# Patient Record
Sex: Male | Born: 1970
Health system: Southern US, Community
[De-identification: ages and names within clinical notes are randomized; demographics above are authoritative.]

## PROBLEM LIST (undated history)

## (undated) DIAGNOSIS — K5730 Diverticulosis of large intestine without perforation or abscess without bleeding: Secondary | ICD-10-CM

## (undated) DIAGNOSIS — I471 Supraventricular tachycardia, unspecified: Secondary | ICD-10-CM

## (undated) DIAGNOSIS — I10 Essential (primary) hypertension: Secondary | ICD-10-CM

## (undated) DIAGNOSIS — K5792 Diverticulitis of intestine, part unspecified, without perforation or abscess without bleeding: Secondary | ICD-10-CM

## (undated) DIAGNOSIS — K219 Gastro-esophageal reflux disease without esophagitis: Secondary | ICD-10-CM

## (undated) DIAGNOSIS — R42 Dizziness and giddiness: Secondary | ICD-10-CM

## (undated) DIAGNOSIS — T7840XA Allergy, unspecified, initial encounter: Secondary | ICD-10-CM

## (undated) DIAGNOSIS — J309 Allergic rhinitis, unspecified: Secondary | ICD-10-CM

## (undated) DIAGNOSIS — E785 Hyperlipidemia, unspecified: Secondary | ICD-10-CM

## (undated) HISTORY — DX: Hyperlipidemia, unspecified: E78.5

## (undated) HISTORY — DX: Allergic rhinitis, unspecified: J30.9

## (undated) HISTORY — DX: Essential (primary) hypertension: I10

## (undated) HISTORY — DX: Gastro-esophageal reflux disease without esophagitis: K21.9

## (undated) HISTORY — DX: Diverticulosis of large intestine without perforation or abscess without bleeding: K57.30

## (undated) HISTORY — DX: Supraventricular tachycardia, unspecified: I47.10

## (undated) HISTORY — DX: Diverticulitis of intestine, part unspecified, without perforation or abscess without bleeding: K57.92

## (undated) HISTORY — PX: NASAL SEPTOPLASTY W/ TURBINOPLASTY: SHX2070

## (undated) HISTORY — DX: Dizziness and giddiness: R42

## (undated) HISTORY — DX: Allergy, unspecified, initial encounter: T78.40XA

## (undated) HISTORY — PX: VASECTOMY: SHX75

## (undated) HISTORY — DX: Supraventricular tachycardia: I47.1

---

## 2006-10-02 ENCOUNTER — Emergency Department (HOSPITAL_COMMUNITY): Admission: EM | Admit: 2006-10-02 | Discharge: 2006-10-02 | Payer: Self-pay | Admitting: Emergency Medicine

## 2006-10-03 ENCOUNTER — Ambulatory Visit: Payer: Self-pay

## 2007-12-05 ENCOUNTER — Encounter: Admission: RE | Admit: 2007-12-05 | Discharge: 2007-12-05 | Payer: Self-pay | Admitting: Internal Medicine

## 2008-01-09 DIAGNOSIS — I10 Essential (primary) hypertension: Secondary | ICD-10-CM | POA: Insufficient documentation

## 2008-01-12 ENCOUNTER — Ambulatory Visit: Payer: Self-pay | Admitting: Internal Medicine

## 2008-01-12 DIAGNOSIS — K219 Gastro-esophageal reflux disease without esophagitis: Secondary | ICD-10-CM | POA: Insufficient documentation

## 2008-01-12 DIAGNOSIS — R198 Other specified symptoms and signs involving the digestive system and abdomen: Secondary | ICD-10-CM | POA: Insufficient documentation

## 2008-01-12 DIAGNOSIS — R079 Chest pain, unspecified: Secondary | ICD-10-CM | POA: Insufficient documentation

## 2008-04-14 ENCOUNTER — Telehealth (INDEPENDENT_AMBULATORY_CARE_PROVIDER_SITE_OTHER): Payer: Self-pay | Admitting: *Deleted

## 2008-04-30 ENCOUNTER — Encounter: Payer: Self-pay | Admitting: Internal Medicine

## 2010-07-05 ENCOUNTER — Emergency Department (HOSPITAL_BASED_OUTPATIENT_CLINIC_OR_DEPARTMENT_OTHER)
Admission: EM | Admit: 2010-07-05 | Discharge: 2010-07-06 | Disposition: A | Discharge status: Home / Self Care | Payer: Worker's Compensation | Attending: Emergency Medicine | Admitting: Emergency Medicine

## 2010-07-05 DIAGNOSIS — Z79899 Other long term (current) drug therapy: Secondary | ICD-10-CM | POA: Insufficient documentation

## 2010-07-05 DIAGNOSIS — Y9289 Other specified places as the place of occurrence of the external cause: Secondary | ICD-10-CM | POA: Insufficient documentation

## 2010-07-05 DIAGNOSIS — I10 Essential (primary) hypertension: Secondary | ICD-10-CM | POA: Insufficient documentation

## 2010-07-05 DIAGNOSIS — E785 Hyperlipidemia, unspecified: Secondary | ICD-10-CM | POA: Insufficient documentation

## 2010-07-05 DIAGNOSIS — S9030XA Contusion of unspecified foot, initial encounter: Secondary | ICD-10-CM | POA: Insufficient documentation

## 2010-07-05 DIAGNOSIS — W208XXA Other cause of strike by thrown, projected or falling object, initial encounter: Secondary | ICD-10-CM | POA: Insufficient documentation

## 2010-07-06 ENCOUNTER — Emergency Department (INDEPENDENT_AMBULATORY_CARE_PROVIDER_SITE_OTHER): Payer: Worker's Compensation

## 2010-07-06 DIAGNOSIS — W208XXA Other cause of strike by thrown, projected or falling object, initial encounter: Secondary | ICD-10-CM

## 2010-07-06 DIAGNOSIS — S9030XA Contusion of unspecified foot, initial encounter: Secondary | ICD-10-CM

## 2010-09-28 ENCOUNTER — Other Ambulatory Visit: Payer: Self-pay | Admitting: Internal Medicine

## 2010-09-28 DIAGNOSIS — R110 Nausea: Secondary | ICD-10-CM

## 2010-09-29 ENCOUNTER — Ambulatory Visit
Admission: RE | Admit: 2010-09-29 | Discharge: 2010-09-29 | Disposition: A | Discharge status: Home / Self Care | Payer: 59 | Source: Ambulatory Visit | Attending: Internal Medicine | Admitting: Internal Medicine

## 2010-09-29 DIAGNOSIS — R110 Nausea: Secondary | ICD-10-CM

## 2010-10-04 ENCOUNTER — Emergency Department (HOSPITAL_COMMUNITY)
Admission: EM | Admit: 2010-10-04 | Discharge: 2010-10-04 | Disposition: A | Discharge status: Home / Self Care | Payer: 59 | Attending: Emergency Medicine | Admitting: Emergency Medicine

## 2010-10-04 ENCOUNTER — Emergency Department (HOSPITAL_COMMUNITY): Payer: 59

## 2010-10-04 DIAGNOSIS — I10 Essential (primary) hypertension: Secondary | ICD-10-CM | POA: Insufficient documentation

## 2010-10-04 DIAGNOSIS — E785 Hyperlipidemia, unspecified: Secondary | ICD-10-CM | POA: Insufficient documentation

## 2010-10-04 DIAGNOSIS — R071 Chest pain on breathing: Secondary | ICD-10-CM | POA: Insufficient documentation

## 2010-10-04 DIAGNOSIS — R079 Chest pain, unspecified: Secondary | ICD-10-CM | POA: Insufficient documentation

## 2010-10-04 LAB — DIFFERENTIAL
Basophils Absolute: 0 10*3/uL (ref 0.0–0.1)
Basophils Relative: 1 % (ref 0–1)
Eosinophils Absolute: 0.2 10*3/uL (ref 0.0–0.7)
Eosinophils Relative: 4 % (ref 0–5)
Monocytes Absolute: 0.4 10*3/uL (ref 0.1–1.0)
Monocytes Relative: 7 % (ref 3–12)
Neutro Abs: 3.1 10*3/uL (ref 1.7–7.7)

## 2010-10-04 LAB — CBC
MCH: 29.9 pg (ref 26.0–34.0)
MCHC: 35.1 g/dL (ref 30.0–36.0)
RDW: 12 % (ref 11.5–15.5)

## 2010-10-04 LAB — POCT I-STAT, CHEM 8
HCT: 44 % (ref 39.0–52.0)
Hemoglobin: 15 g/dL (ref 13.0–17.0)
Potassium: 4 mEq/L (ref 3.5–5.1)
Sodium: 139 mEq/L (ref 135–145)
TCO2: 26 mmol/L (ref 0–100)

## 2010-10-04 LAB — CK TOTAL AND CKMB (NOT AT ARMC)
CK, MB: 1.9 ng/mL (ref 0.3–4.0)
Relative Index: 1.4 (ref 0.0–2.5)

## 2010-11-02 ENCOUNTER — Other Ambulatory Visit: Payer: Self-pay | Admitting: Internal Medicine

## 2010-11-09 ENCOUNTER — Ambulatory Visit
Admission: RE | Admit: 2010-11-09 | Discharge: 2010-11-09 | Disposition: A | Discharge status: Home / Self Care | Payer: 59 | Source: Ambulatory Visit | Attending: Internal Medicine | Admitting: Internal Medicine

## 2010-12-11 ENCOUNTER — Other Ambulatory Visit: Payer: 59

## 2010-12-11 ENCOUNTER — Ambulatory Visit: Payer: 59

## 2010-12-11 ENCOUNTER — Other Ambulatory Visit: Payer: Self-pay | Admitting: *Deleted

## 2010-12-11 ENCOUNTER — Other Ambulatory Visit: Payer: Self-pay | Admitting: Internal Medicine

## 2010-12-11 ENCOUNTER — Encounter: Payer: Self-pay | Admitting: Internal Medicine

## 2010-12-11 ENCOUNTER — Ambulatory Visit (INDEPENDENT_AMBULATORY_CARE_PROVIDER_SITE_OTHER): Payer: 59 | Admitting: Internal Medicine

## 2010-12-11 DIAGNOSIS — R198 Other specified symptoms and signs involving the digestive system and abdomen: Secondary | ICD-10-CM

## 2010-12-11 DIAGNOSIS — R197 Diarrhea, unspecified: Secondary | ICD-10-CM

## 2010-12-11 DIAGNOSIS — K219 Gastro-esophageal reflux disease without esophagitis: Secondary | ICD-10-CM

## 2010-12-11 MED ORDER — CILIDINIUM-CHLORDIAZEPOXIDE 2.5-5 MG PO CAPS: 1.0000 | ORAL_CAPSULE | Freq: Three times a day (TID) | ORAL | Status: DC

## 2010-12-11 NOTE — Progress Notes (Signed)
HISTORY OF PRESENT ILLNESS:  Gerald Armstrong is a 40 y.o. male who presents today regarding change in bowel habits and abdominal discomfort. He was last seen 3 years ago for chest pain, GERD, and change in bowel habits with a tendency toward diarrhea. His current history is that of receiving antibiotics for strep throat in late May 2012. Shortly thereafter he describes "a sour stomach". In particular, bloating, occasional cramping, an increased frequency of bowel movements with mixed consistency. Defecation seems to be postprandial, though rarely has he had nocturnal symptoms. He was initially treated with probiotic without improvement. Subsequently underwent an upper GI series (09-29-10) which revealed GERD. Treated with Protonix with control of classic reflux symptoms but no affect on her abdominal complaints. Thereafter, underwent a CT scan of the abdomen and pelvis (11-09-10) which was unremarkable. Comprehensive metabolic panel 10/24/2010 was normal as was a CBC from the previous month. Should be noted that he was seen in the emergency room 11/03/2010 for chest pain. Negative workup at that time. More recently, he completed a one-week course of metronidazole, again without improvement in symptoms. He denies any of his medications are new around the time of symptoms. He has lost about 8 pounds in the past 3 months. He attributes this to change in diet.  REVIEW OF SYSTEMS:  All non-GI ROS negative except for decreased energy, sinus trouble, sleeping problems.  Past Medical History  Diagnosis Date  . Other and unspecified hyperlipidemia   . Unspecified essential hypertension   . Atrial fibrillation     Past Surgical History  Procedure Date  . Nasal sinus surgery     Social History Gerald Armstrong  reports that he has never smoked. He has never used smokeless tobacco. He reports that he drinks alcohol. He reports that he does not use illicit drugs.  family history includes Breast cancer in  his mother and Heart disease in his father.  No Known Allergies     PHYSICAL EXAMINATION: Vital signs: BP 130/86  Pulse 84  Ht 5\' 11"  (1.803 m)  Wt 175 lb 12.8 oz (79.742 kg)  BMI 24.52 kg/m2  Constitutional: generally well-appearing, no acute distress Psychiatric: alert and oriented x3, cooperative Eyes: extraocular movements intact, anicteric, conjunctiva pink Mouth: oral pharynx moist, no lesions Neck: supple no lymphadenopathy Cardiovascular: heart regular rate and rhythm, no murmur Lungs: clear to auscultation bilaterally Abdomen: soft, nontender, nondistended, no obvious ascites, no peritoneal signs, normal bowel sounds, no organomegaly Rectal: Ommitted Extremities: no lower extremity edema bilaterally Skin: no lesions on visible extremities Neuro: No focal deficits.   ASSESSMENT:  #1. Change in bowel habits manifested by bloating and postprandial urgency with loose stools. Symptoms consistent with irritable bowel. Primary versus postinfectious. Prior history of change in bowel habits 3 years ago in the setting of stress. #2. GERD without alarm features   PLAN:  #1. Celiac panel and serum IgA to screen for sprue #2. Prescribed Librax one before meals and at night when necessary. Warned about possible lethargy or dry mouth. #3. Routine office followup in about 6 weeks. He knows to contact the office in the interim for new or worsening symptoms. If he is no better, we might consider endoscopic evaluations with biopsies.

## 2010-12-11 NOTE — Patient Instructions (Signed)
Go to basement floor today and have labs drawn Prescription for generic Librax sent to pharmacy.

## 2010-12-12 LAB — IGA: IgA: 266 mg/dL (ref 68–378)

## 2010-12-12 LAB — GLIA (IGA/G) + TTG IGA
Gliadin IgA: 6.4 U/mL (ref <20)
Tissue Transglutaminase Ab, IgA: 5 U/mL (ref <20)

## 2010-12-13 ENCOUNTER — Telehealth: Payer: Self-pay

## 2010-12-13 NOTE — Telephone Encounter (Signed)
Message copied by Michele Mcalpine on Wed Dec 13, 2010  9:40 AM ------      Message from: Hilarie Fredrickson      Created: Tue Dec 12, 2010  2:18 PM       Please let pt know that his celiac testing was normal

## 2010-12-13 NOTE — Telephone Encounter (Signed)
Pt aware.

## 2011-01-19 ENCOUNTER — Encounter: Payer: Self-pay | Admitting: *Deleted

## 2011-01-22 ENCOUNTER — Ambulatory Visit (INDEPENDENT_AMBULATORY_CARE_PROVIDER_SITE_OTHER): Payer: 59 | Admitting: Internal Medicine

## 2011-01-22 ENCOUNTER — Encounter: Payer: Self-pay | Admitting: Internal Medicine

## 2011-01-22 VITALS — BP 124/88 | HR 92 | Ht 71.0 in | Wt 183.0 lb

## 2011-01-22 DIAGNOSIS — K219 Gastro-esophageal reflux disease without esophagitis: Secondary | ICD-10-CM

## 2011-01-22 DIAGNOSIS — K589 Irritable bowel syndrome without diarrhea: Secondary | ICD-10-CM

## 2011-01-22 NOTE — Patient Instructions (Signed)
Please follow up as needed with Dr Marina Goodell.

## 2011-01-22 NOTE — Progress Notes (Signed)
HISTORY OF PRESENT ILLNESS:  Gerald Armstrong is a 40 y.o. male who was evaluated 12/11/2010 regarding change in bowel habits and abdominal discomfort. See that dictation for details. Patient was felt to have irritable bowel syndrome, primary versus postinfectious. Also, GERD without alarm features. Screening for celiac disease was negative. He was prescribed Librax. Routine followup scheduled for this time. Since that office visit, he reports marked improvement in symptoms. Specifically, resolution of diarrhea and little to no abdominal cramping. He initially took Librax 3-4 times daily. Currently twice daily. He has had a pound weight gain. No new GI complaints.  REVIEW OF SYSTEMS:  All non-GI ROS negative except for chest pain, anxiety, sinus and allergy trouble, fatigue.  Past Medical History  Diagnosis Date  . Other and unspecified hyperlipidemia   . Unspecified essential hypertension   . Atrial fibrillation   . GERD (gastroesophageal reflux disease)     Past Surgical History  Procedure Date  . Nasal septoplasty w/ turbinoplasty     Social History Gerald Armstrong  reports that he has never smoked. He has never used smokeless tobacco. He reports that he drinks alcohol. He reports that he does not use illicit drugs.  family history includes Breast cancer in his mother and Heart disease in his father.  No Known Allergies     PHYSICAL EXAMINATION: Vital signs: BP 124/88  Pulse 92  Ht 5\' 11"  (1.803 m)  Wt 183 lb (83.008 kg)  BMI 25.52 kg/m2  SpO2 98% General: Well-developed, well-nourished, no acute distress HEENT: Sclerae are anicteric, conjunctiva pink. Oral mucosa intact Lungs: Clear Heart: Regular Abdomen: soft, nontender, nondistended, no obvious ascites, no peritoneal signs, normal bowel sounds. No organomegaly. Extremities: No edema Psychiatric: alert and oriented x3. Cooperative      ASSESSMENT:  #1. IBS. Postinfectious versus primary. Improved with  Librax #2. Mild GERD without alarm features. Currently asymptomatic    PLAN:  #1. May discontinue Librax. However, may be used on a when necessary basis if needed for recurrent symptoms. #2. Return to the care of Dr. Jacky Armstrong. GI followup as needed

## 2011-01-31 LAB — I-STAT 8, (EC8 V) (CONVERTED LAB)
Acid-Base Excess: 2
Bicarbonate: 29.3 — ABNORMAL HIGH
Glucose, Bld: 92
Hemoglobin: 16.3
Operator id: 285841
Potassium: 4.5
Sodium: 139
TCO2: 31

## 2011-01-31 LAB — CBC
MCHC: 34.3
MCV: 88.2
Platelets: 337
RBC: 5
RDW: 12.9

## 2011-01-31 LAB — DIFFERENTIAL
Basophils Absolute: 0
Basophils Relative: 1
Eosinophils Absolute: 0.1
Neutro Abs: 4.2
Neutrophils Relative %: 66

## 2011-01-31 LAB — POCT CARDIAC MARKERS
Operator id: 285841
Troponin i, poc: <0.05

## 2011-02-01 ENCOUNTER — Encounter: Payer: Self-pay | Admitting: Cardiovascular Disease

## 2011-02-02 ENCOUNTER — Ambulatory Visit (INDEPENDENT_AMBULATORY_CARE_PROVIDER_SITE_OTHER): Payer: 59 | Admitting: Cardiovascular Disease

## 2011-02-02 ENCOUNTER — Encounter: Payer: Self-pay | Admitting: Cardiovascular Disease

## 2011-02-02 VITALS — BP 120/82 | HR 82 | Resp 18 | Ht 71.0 in | Wt 178.8 lb

## 2011-02-02 DIAGNOSIS — R079 Chest pain, unspecified: Secondary | ICD-10-CM

## 2011-02-02 NOTE — Assessment & Plan Note (Signed)
His pain has atypical and typical features. Will arrange exercise treadmill stress test to exclude ischemia. Will also arrange echocardiogram to exclude structural heart disease. Low probability of CAD.

## 2011-02-02 NOTE — Progress Notes (Signed)
History of Present Illness:40 yo WM with h/o HTN, HLD, vertigo here today for cardiac evaluation. He describes recent episodes of vertigo and feeling lightheaded when working out with weights and with cardiovascular. Last week, after exercising, he felt nauseous, dizzy and fatigued. He also describes a discomfort in his chest with rest and with exercise. This seems more prevalent after exercise but never with exercise. This can last for several hours and up to the whole day. There is associated anxiety but no real SOB, nausea or vomiting. He ran several miles 2 weeks ago and had no chest pain but his chest was sore for the rest of the day. He had a stress test in 2008 that was normal.   Primary care is Dr. Jacky Kindle with [facility].   Past Medical History  Diagnosis Date  . GERD (gastroesophageal reflux disease)   . Vertigo   . HTN (hypertension)   . HLD (hyperlipidemia)     Past Surgical History  Procedure Date  . Nasal septoplasty w/ turbinoplasty   . Vasectomy     Current Outpatient Prescriptions  Medication Sig Dispense Refill  . aspirin 81 MG tablet Take 81 mg by mouth daily.        Marland Kitchen atorvastatin (LIPITOR) 20 MG tablet Take 20 mg by mouth daily.        . clidinium-chlordiazePOXIDE (LIBRAX) 2.5-5 MG per capsule Take 1 capsule by mouth daily.        Marland Kitchen guaiFENesin (MUCINEX) 600 MG 12 hr tablet Take 1,200 mg by mouth daily as needed.        Marland Kitchen ibuprofen (ADVIL,MOTRIN) 200 MG tablet Take 200 mg by mouth every 6 (six) hours as needed.        . loratadine (CLARITIN) 10 MG tablet Take 10 mg by mouth as needed.       Marland Kitchen losartan (COZAAR) 50 MG tablet Take 50 mg by mouth daily.        . Multiple Vitamin (MULTIVITAMIN) capsule Take 1 capsule by mouth daily.        . pantoprazole (PROTONIX) 40 MG tablet Take 40 mg by mouth daily.          No Known Allergies  History   Social History  . Marital Status: Married    Spouse Name: N/A    Number of Children: 2  . Years of  Education: N/A   Occupational History  . [employer]    Social History Main Topics  . Smoking status: Never Smoker   . Smokeless tobacco: Never Used  . Alcohol Use: Yes     Socially   . Drug Use: No  . Sexually Active: Not on file   Other Topics Concern  . Not on file   Social History Narrative   Patient gets regular exercise     Family History  Problem Relation Age of Onset  . Breast cancer Mother   . Heart disease Father     Review of Systems:  As stated in the HPI and otherwise negative.   BP 120/82  Pulse 82  Resp 18  Ht 5\' 11"  (1.803 m)  Wt 178 lb 12.8 oz (81.103 kg)  BMI 24.94 kg/m2  Physical Examination: General: Well developed, well nourished, NAD HEENT: OP clear, mucus membranes moist SKIN: warm, dry. No rashes. Neuro: No focal deficits Musculoskeletal: Muscle strength 5/5 all ext Psychiatric: Mood and affect normal Neck: No JVD, no carotid bruits, no thyromegaly, no lymphadenopathy. Lungs:Clear bilaterally, no wheezes, rhonci, crackles  Cardiovascular: Regular rate and rhythm. No murmurs, gallops or rubs. Abdomen:Soft. Bowel sounds present. Non-tender.  Extremities: No lower extremity edema. Pulses are 2 + in the bilateral DP/PT.  QIO:NGEXB rhythm, rate 89 bpm. Sinus arrhythmia.

## 2011-02-02 NOTE — Patient Instructions (Signed)
Your physician has requested that you have an echocardiogram. Echocardiography is a painless test that uses sound waves to create images of your heart. It provides your doctor with information about the size and shape of your heart and how well your heart's chambers and valves are working. This procedure takes approximately one hour. There are no restrictions for this procedure.  Your physician has requested that you have an exercise tolerance test. For further information please visit [email or web address]. Please also follow instruction sheet, as given.   

## 2011-02-08 ENCOUNTER — Ambulatory Visit (HOSPITAL_COMMUNITY): Payer: 59 | Attending: Cardiovascular Disease | Admitting: Radiology

## 2011-02-08 ENCOUNTER — Ambulatory Visit (INDEPENDENT_AMBULATORY_CARE_PROVIDER_SITE_OTHER): Payer: 59 | Admitting: Cardiovascular Disease

## 2011-02-08 DIAGNOSIS — R079 Chest pain, unspecified: Secondary | ICD-10-CM

## 2011-02-08 DIAGNOSIS — I059 Rheumatic mitral valve disease, unspecified: Secondary | ICD-10-CM | POA: Insufficient documentation

## 2011-02-08 DIAGNOSIS — I10 Essential (primary) hypertension: Secondary | ICD-10-CM | POA: Insufficient documentation

## 2011-02-08 DIAGNOSIS — E785 Hyperlipidemia, unspecified: Secondary | ICD-10-CM | POA: Insufficient documentation

## 2011-02-08 DIAGNOSIS — I079 Rheumatic tricuspid valve disease, unspecified: Secondary | ICD-10-CM | POA: Insufficient documentation

## 2011-02-08 DIAGNOSIS — R072 Precordial pain: Secondary | ICD-10-CM | POA: Insufficient documentation

## 2011-02-08 NOTE — Progress Notes (Signed)
Exercise Treadmill Test  Pre-Exercise Testing Evaluation Rhythm: normal sinus  Rate: 78   PR:  17 QRS:  .07  QT:  .38 QTc: 45     Test  Exercise Tolerance Test Ordering MD: Melene Muller, MD  Interpreting MD:  Melene Muller, MD  Unique Test No: 1  Treadmill:  1  Indication for ETT: chest pain - rule out ischemia  Contraindication to ETT: No   Stress Modality: exercise - treadmill  Cardiac Imaging Performed: non   Protocol: standard Bruce - maximal  Max BP:  172/89  Max MPHR (bpm):  178 85% MPR (bpm):  151  MPHR obtained (bpm):  184 % MPHR obtained:  102%  Reached 85% MPHR (min:sec):  7:00 Total Exercise Time (min-sec):  12:00  Workload in METS:  13.4 Borg Scale: 17  Reason ETT Terminated:  fatigue    ST Segment Analysis At Rest: normal ST segments - no evidence of significant ST depression With Exercise: no evidence of significant ST depression  Other Information Arrhythmia:  Yes Angina during ETT:  absent (0) Quality of ETT:  non-diagnostic  ETT Interpretation:  normal - no evidence of ischemia by ST analysis  Comments: Pt exercised for 12 minutes. He had no chest pain with exercise. There were no ischemic EKG changes. He had an isolated PVC with exercise.   Recommendations:  No further cardiac workup at this time.

## 2013-06-01 DIAGNOSIS — K5730 Diverticulosis of large intestine without perforation or abscess without bleeding: Secondary | ICD-10-CM

## 2013-06-01 HISTORY — DX: Diverticulosis of large intestine without perforation or abscess without bleeding: K57.30

## 2013-06-03 ENCOUNTER — Encounter: Payer: Self-pay | Admitting: *Deleted

## 2013-06-05 ENCOUNTER — Ambulatory Visit (INDEPENDENT_AMBULATORY_CARE_PROVIDER_SITE_OTHER): Payer: 59 | Admitting: Internal Medicine

## 2013-06-05 ENCOUNTER — Encounter: Payer: Self-pay | Admitting: Internal Medicine

## 2013-06-05 VITALS — BP 124/72 | HR 86 | Ht 71.0 in | Wt 179.0 lb

## 2013-06-05 DIAGNOSIS — K5732 Diverticulitis of large intestine without perforation or abscess without bleeding: Secondary | ICD-10-CM

## 2013-06-05 DIAGNOSIS — R933 Abnormal findings on diagnostic imaging of other parts of digestive tract: Secondary | ICD-10-CM

## 2013-06-05 DIAGNOSIS — R195 Other fecal abnormalities: Secondary | ICD-10-CM

## 2013-06-05 MED ORDER — MOVIPREP 100 G PO SOLR: 1.0000 | Freq: Once | ORAL | Status: DC

## 2013-06-05 NOTE — Progress Notes (Signed)
HISTORY OF PRESENT ILLNESS:  Gerald Armstrong is a 43 y.o. male  Seen previously for change in bowel habits and abdominal discomfort. Felt to have postinfectious at that time IBS. Since that time (October 2012) he has done well without significant GI complaints. However, in recent weeks he has noticed nagging lower abdominal discomfort. He was evaluated. Unremarkable CBC and comprehensive metabolic panel. Normal urinalysis. Hemoccult-positive stool. CT scan of the abdomen and pelvis performed elsewhere on 06/01/2013 revealed sigmoid diverticulitis. The patient was prescribed ciprofloxacin and metronidazole and his appointment arranged upon referral from his PCP. Since that time, he has felt somewhat better. Still with some discomfort. No fevers. Bowel habits unchanged. Denies melena or hematochezia. No other GI complaints  REVIEW OF SYSTEMS:  All non-GI ROS negative upon review of all systems  Past Medical History  Diagnosis Date  . GERD (gastroesophageal reflux disease)   . Vertigo   . HTN (hypertension)   . HLD (hyperlipidemia)   . Allergic rhinitis   . Diverticulosis of colon (without mention of hemorrhage) 06/01/2013    CT Scan     Past Surgical History  Procedure Laterality Date  . Nasal septoplasty w/ turbinoplasty    . Vasectomy      Social History Gerald Armstrong  reports that he has never smoked. He has never used smokeless tobacco. He reports that he drinks alcohol. He reports that he does not use illicit drugs.  family history includes Breast cancer in his mother; Heart attack in his father and paternal grandfather; Heart disease in his father.  No Known Allergies     PHYSICAL EXAMINATION: Vital signs: BP 124/72  Pulse 86  Ht 5\' 11"  (1.803 m)  Wt 179 lb (81.194 kg)  BMI 24.98 kg/m2  Constitutional: generally well-appearing, no acute distress Psychiatric: alert and oriented x3, cooperative Eyes: extraocular movements intact, anicteric, conjunctiva pink Mouth:  oral pharynx moist, no lesions Neck: supple no lymphadenopathy Cardiovascular: heart regular rate and rhythm, no murmur Lungs: clear to auscultation bilaterally Abdomen: soft, mild left lower quadrant tenderness with deep palpation, nondistended, no obvious ascites, no peritoneal signs, normal bowel sounds, no organomegaly Rectal: Deferred until colonoscopy Extremities: no lower extremity edema bilaterally Skin: no lesions on visible extremities Neuro: No focal deficits.   ASSESSMENT:  #1. Acute sigmoid diverticulitis #2. Hemoccult-positive stool. Possibly related to diverticulitis. Rule out other causes.  PLAN:  #1. Complete course of ciprofloxacin and metronidazole #2. Discussion on diverticular disease #3. Supplemental literature on diverticular disease provided #4. Long-term, high-fiber diet #5. Schedule colonoscopy in 4 weeks to evaluate Hemoccult-positive stool.The nature of the procedure, as well as the risks, benefits, and alternatives were carefully and thoroughly reviewed with the patient. Ample time for discussion and questions allowed. The patient understood, was satisfied, and agreed to proceed. Movi prep prescribed. The patient instructed on its use

## 2013-06-05 NOTE — Patient Instructions (Signed)

## 2013-06-09 ENCOUNTER — Encounter: Payer: Self-pay | Admitting: Internal Medicine

## 2013-06-17 ENCOUNTER — Telehealth: Payer: Self-pay | Admitting: Internal Medicine

## 2013-06-17 MED ORDER — AMOXICILLIN-POT CLAVULANATE 875-125 MG PO TABS: 1.0000 | ORAL_TABLET | Freq: Two times a day (BID) | ORAL | Status: DC

## 2013-06-17 NOTE — Telephone Encounter (Signed)
Pt saw Dr. Marina GoodellPerry 06/05/13 for an OV. Pt was on antibiotics at that time for diverticulitis. Pts PCP placed him on the meds. Pt states he finished those last Thursday morning but he has started to have the same pain he was having in his pelvic area. States the pain is the same, he is scheduled for colon on 3/17 but wants to know if he needs to do anything different. Please advise.

## 2013-06-17 NOTE — Telephone Encounter (Signed)
Pt aware and script sent to the pharmacy. 

## 2013-06-17 NOTE — Telephone Encounter (Signed)
Yes, place him on Augmentin 875 mg twice daily for 10 days. Keep plans for colonoscopy

## 2013-06-30 ENCOUNTER — Encounter: Payer: Self-pay | Admitting: Internal Medicine

## 2013-06-30 ENCOUNTER — Ambulatory Visit (AMBULATORY_SURGERY_CENTER): Payer: 59 | Admitting: Internal Medicine

## 2013-06-30 VITALS — BP 127/70 | HR 106 | Temp 98.7°F | Resp 8 | Ht 71.0 in | Wt 179.0 lb

## 2013-06-30 DIAGNOSIS — K5730 Diverticulosis of large intestine without perforation or abscess without bleeding: Secondary | ICD-10-CM

## 2013-06-30 DIAGNOSIS — K5732 Diverticulitis of large intestine without perforation or abscess without bleeding: Secondary | ICD-10-CM

## 2013-06-30 DIAGNOSIS — R933 Abnormal findings on diagnostic imaging of other parts of digestive tract: Secondary | ICD-10-CM

## 2013-06-30 MED ORDER — SODIUM CHLORIDE 0.9 % IV SOLN: 500.0000 mL | INTRAVENOUS | Status: DC

## 2013-06-30 NOTE — Progress Notes (Signed)
Lidocaine-40mg IV prior to Propofol InductionPropofol given over incremental dosages 

## 2013-06-30 NOTE — Patient Instructions (Signed)

## 2013-06-30 NOTE — Op Note (Signed)
[facility] [address] [place], [id number]   COLONOSCOPY PROCEDURE REPORT  PATIENT: Gerald Armstrong, Gerald S.  MR#: [id number] BIRTHDATE: 11/26/1970 , 43  yrs. old GENDER: Male ENDOSCOPIST: Roxy CedarJohn N Shaqueena Mauceri Jr, MD REFERRED WU:[id number]:Devaun Jacky KindleAronson, M.D. PROCEDURE DATE:  06/30/2013 PROCEDURE:   Colonoscopy, diagnostic First Screening Colonoscopy - Avg.  risk and is 50 yrs.  old or older - No.  Prior Negative Screening - Now for repeat screening. N/A  History of Adenoma - Now for follow-up colonoscopy & has been > or = to 3 yrs.  N/A  Polyps Removed Today? No.  Recommend repeat exam, <10 yrs? No. ASA CLASS:   Class I INDICATIONS:an abnormal CT and abdominal pain in the lower left quadrant. presumed acute diverticulitis treated with antibiotics MEDICATIONS: MAC sedation, administered by CRNA and propofol (Diprivan) 300mg  IV  DESCRIPTION OF PROCEDURE:   After the risks benefits and alternatives of the procedure were thoroughly explained, informed consent was obtained.  A digital rectal exam revealed no abnormalities of the rectum.   The LB [id number]  endoscope was introduced through the anus and advanced to the cecum, which was identified by both the appendix and ileocecal valve. No adverse events experienced.   The quality of the prep was excellent, using MoviPrep  The instrument was then slowly withdrawn as the colon was fully examined.   COLON FINDINGS: Moderate diverticulosis was noted in the left colon. The colon was otherwise normal.  There was no  inflammation, polyps or cancers unless previously stated.  Retroflexed views revealed no abnormalities. The time to cecum=3 minutes 12 seconds. Withdrawal time=8 minutes 33 seconds.  The scope was withdrawn and the procedure completed.  COMPLICATIONS: There were no complications.  ENDOSCOPIC IMPRESSION: 1.   Moderate diverticulosis was noted in the left colon 2.   The colon was otherwise  normal  RECOMMENDATIONS: 1. Continue current colorectal screening recommendations for "routine risk" patients with a repeat colonoscopy in 10 years.   eSigned:  Roxy CedarJohn N Ashlie Mcmenamy Jr, MD 06/30/2013 2:09 PM   cc: Geoffry Paradiseichard Aronson, MD and The Patient

## 2013-07-01 ENCOUNTER — Telehealth: Payer: Self-pay | Admitting: *Deleted

## 2013-07-01 NOTE — Telephone Encounter (Signed)
  Follow up Call-  Call back number 06/30/2013  Post procedure Call Back phone  # [phone]  Permission to leave phone message Yes   [facility]

## 2013-07-24 ENCOUNTER — Telehealth: Payer: Self-pay

## 2013-07-24 NOTE — Telephone Encounter (Signed)
Voicemail left by pt stating that he is having abdominal pain again like he did when he was placed on meds for diverticulitis. Left message for pt to call back.

## 2013-07-29 NOTE — Telephone Encounter (Signed)
Left message for pt to call back  °

## 2013-07-30 NOTE — Telephone Encounter (Signed)
Left message for pt to call back. After multiple attempts have been unable to reach pt. 

## 2014-01-04 ENCOUNTER — Encounter: Payer: Self-pay | Admitting: Family Medicine

## 2014-01-04 ENCOUNTER — Ambulatory Visit (HOSPITAL_BASED_OUTPATIENT_CLINIC_OR_DEPARTMENT_OTHER)
Admission: RE | Admit: 2014-01-04 | Discharge: 2014-01-04 | Disposition: A | Discharge status: Home / Self Care | Payer: 59 | Source: Ambulatory Visit | Attending: Family Medicine | Admitting: Family Medicine

## 2014-01-04 ENCOUNTER — Ambulatory Visit (INDEPENDENT_AMBULATORY_CARE_PROVIDER_SITE_OTHER): Payer: 59 | Admitting: Family Medicine

## 2014-01-04 VITALS — BP 130/80 | HR 85 | Ht 71.0 in | Wt 183.0 lb

## 2014-01-04 DIAGNOSIS — S8990XA Unspecified injury of unspecified lower leg, initial encounter: Secondary | ICD-10-CM

## 2014-01-04 DIAGNOSIS — X58XXXA Exposure to other specified factors, initial encounter: Secondary | ICD-10-CM | POA: Insufficient documentation

## 2014-01-04 DIAGNOSIS — S99912A Unspecified injury of left ankle, initial encounter: Secondary | ICD-10-CM

## 2014-01-04 DIAGNOSIS — S99929A Unspecified injury of unspecified foot, initial encounter: Secondary | ICD-10-CM

## 2014-01-04 DIAGNOSIS — S82899A Other fracture of unspecified lower leg, initial encounter for closed fracture: Secondary | ICD-10-CM | POA: Diagnosis not present

## 2014-01-04 DIAGNOSIS — M25579 Pain in unspecified ankle and joints of unspecified foot: Secondary | ICD-10-CM | POA: Insufficient documentation

## 2014-01-04 DIAGNOSIS — S99919A Unspecified injury of unspecified ankle, initial encounter: Secondary | ICD-10-CM

## 2014-01-04 NOTE — Patient Instructions (Addendum)
You have an ankle sprain. Ice the area for 15 minutes at a time, 3-4 times a day Aleve 2 tabs twice a day with food OR ibuprofen 3 tabs three times a day with food for pain and inflammation. Elevate above the level of your heart when possible Use laceup ankle brace to help with stability while you recover from this injury. Come out of the brace twice a day to do Up/down and alphabet exercises 2-3 sets of each. Start theraband strengthening exercises when directed (about 1-2 weeks) - once a day 3 sets of 10. Consider physical therapy for strengthening and balance exercises in the future. Follow up with me in 2 weeks for reevaluation.

## 2014-01-05 ENCOUNTER — Encounter: Payer: Self-pay | Admitting: Family Medicine

## 2014-01-05 DIAGNOSIS — S99912A Unspecified injury of left ankle, initial encounter: Secondary | ICD-10-CM | POA: Insufficient documentation

## 2014-01-05 NOTE — Assessment & Plan Note (Signed)
Reviewed his radiographs - area of possible avulsion fracture looks well-corticated to me suggesting a remote avulsion fracture, not a new one.  These are treated similar to high grade ankle sprains regardless.  Start with icing, nsaids, elevation, ASO.  HEP reviewed.  F/u in 2 weeks.  Consider PT in future if not improving.  His longstanding numbness medially suggests tarsal tunnel - continue with orthotics.

## 2014-01-05 NOTE — Progress Notes (Signed)
Patient ID: Gerald Armstrong, male   DOB: [birth year], 43 y.o.   MRN: [id number]  PCP: Minda Meo, MD  Subjective:   HPI: Patient is a 43 y.o. male here for left ankle injury.  Patient reports he has injured left ankle 3 times in past 3-4 years, inverting each time. Most recent injury was on 9/18 when hiking - inverted again. Able to bear weight following this. Actually hiked 12 more miles on 9/19. Used a walking stick.  Duct taped the ankle as well for support. Some swelling and lateral bruising. No radiographs. For a few months has had numbness medial ankle and bottom of foot. Uses superfeet insoles.  Past Medical History  Diagnosis Date  . GERD (gastroesophageal reflux disease)   . Vertigo   . HTN (hypertension)   . HLD (hyperlipidemia)   . Allergic rhinitis   . Diverticulosis of colon (without mention of hemorrhage) 06/01/2013    CT Scan     Current Outpatient Prescriptions on File Prior to Visit  Medication Sig Dispense Refill  . atorvastatin (LIPITOR) 20 MG tablet Take 20 mg by mouth daily.        Marland Kitchen losartan (COZAAR) 50 MG tablet Take 50 mg by mouth daily.        . pantoprazole (PROTONIX) 40 MG tablet Take 40 mg by mouth daily.        . Multiple Vitamin (MULTIVITAMIN) capsule Take 1 capsule by mouth daily.         No current facility-administered medications on file prior to visit.    Past Surgical History  Procedure Laterality Date  . Nasal septoplasty w/ turbinoplasty    . Vasectomy      Allergies  Allergen Reactions  . Sulfa Antibiotics     Rash and hives.    History   Social History  . Marital Status: Married    Spouse Name: N/A    Number of Children: 2  . Years of Education: N/A   Occupational History  . [employer]    Social History Main Topics  . Smoking status: Never Smoker   . Smokeless tobacco: Never Used  . Alcohol Use: Yes     Comment: Socially   . Drug Use: No  . Sexual Activity: Not on file   Other Topics Concern   . Not on file   Social History Narrative   Patient gets regular exercise     Family History  Problem Relation Age of Onset  . Breast cancer Mother   . Heart disease Father   . Heart attack Father   . Heart attack Paternal Grandfather     BP 130/80  Pulse 85  Ht  (1.803 m)  Wt 183 lb (83.008 kg)  BMI 25.53 kg/m2  Review of Systems: See HPI above.    Objective:  Physical Exam:  Gen: NAD  Left ankle/foot: Mod lateral swelling.  Bruising lateral heel.  No other deformity. FROM with 5/5 strength. TTP lateral malleolus and over ATFL. 1+ painful ant drawer and talar tilt.   Negative syndesmotic compression. Thompsons test negative. Sensation diminished medial ankle across plantar foot. Negative tinels tarsal tunnel.    Assessment & Plan:  1. Left ankle injury - Reviewed his radiographs - area of possible avulsion fracture looks well-corticated to me suggesting a remote avulsion fracture, not a new one.  These are treated similar to high grade ankle sprains regardless.  Start with icing, nsaids, elevation, ASO.  HEP reviewed.  F/u in 2  weeks.  Consider PT in future if not improving.  His longstanding numbness medially suggests tarsal tunnel - continue with orthotics.

## 2014-01-19 ENCOUNTER — Ambulatory Visit (INDEPENDENT_AMBULATORY_CARE_PROVIDER_SITE_OTHER): Payer: 59 | Admitting: Family Medicine

## 2014-01-19 ENCOUNTER — Encounter: Payer: Self-pay | Admitting: Family Medicine

## 2014-01-19 VITALS — BP 110/74 | HR 76 | Ht 71.0 in | Wt 185.0 lb

## 2014-01-19 DIAGNOSIS — S99912D Unspecified injury of left ankle, subsequent encounter: Secondary | ICD-10-CM

## 2014-01-19 NOTE — Patient Instructions (Signed)
Use the ankle brace when on irregular surfaces (trail running, hiking) for next 4 weeks - consider using beyond this too. Do home exercises with the band daily for next 4 weeks. Icing, advil only if needed. No restrictions on activities. Follow up with me as needed.

## 2014-01-20 ENCOUNTER — Encounter: Payer: Self-pay | Admitting: Family Medicine

## 2014-01-20 NOTE — Progress Notes (Signed)
Patient ID: Gerald Armstrong, male   DOB: 1971-03-10, 43 y.o.   MRN: [id number]  PCP: Gerald Armstrong,Gerald Armstrong  Subjective:   HPI: Patient is a 43 y.o. male here for left ankle injury.  9/21: Patient reports he has injured left ankle 3 times in past 3-4 years, inverting each time. Most recent injury was on 9/18 when hiking - inverted again. Able to bear weight following this. Actually hiked 12 more miles on 9/19. Used a walking stick.  Duct taped the ankle as well for support. Some swelling and lateral bruising. No radiographs. For a few months has had numbness medial ankle and bottom of foot. Uses superfeet insoles.  10/6: Patient reports ankle is much better, down to 1/10 level of pain. Sore by end of day. Doing HEP - took a week to start these due to pain that first week. Took advil initially, iced area. Used ASO first week.  Past Medical History  Diagnosis Date  . GERD (gastroesophageal reflux disease)   . Vertigo   . HTN (hypertension)   . HLD (hyperlipidemia)   . Allergic rhinitis   . Diverticulosis of colon (without mention of hemorrhage) 06/01/2013    CT Scan     Current Outpatient Prescriptions on File Prior to Visit  Medication Sig Dispense Refill  . atorvastatin (LIPITOR) 20 MG tablet Take 20 mg by mouth daily.        Marland Kitchen. losartan (COZAAR) 50 MG tablet Take 50 mg by mouth daily.        . Multiple Vitamin (MULTIVITAMIN) capsule Take 1 capsule by mouth daily.        . pantoprazole (PROTONIX) 40 MG tablet Take 40 mg by mouth daily.         No current facility-administered medications on file prior to visit.    Past Surgical History  Procedure Laterality Date  . Nasal septoplasty w/ turbinoplasty    . Vasectomy      Allergies  Allergen Reactions  . Sulfa Antibiotics     Rash and hives.    History   Social History  . Marital Status: Married    Spouse Name: N/A    Number of Children: 2  . Years of Education: N/A   Occupational History  . CFO  [employer]    Social History Main Topics  . Smoking status: Never Smoker   . Smokeless tobacco: Never Used  . Alcohol Use: Yes     Comment: Socially   . Drug Use: No  . Sexual Activity: Not on file   Other Topics Concern  . Not on file   Social History Narrative   Patient gets regular exercise     Family History  Problem Relation Age of Onset  . Breast cancer Mother   . Heart disease Father   . Heart attack Father   . Heart attack Paternal Grandfather     BP 110/74  Pulse 76  Ht 5\' 11"  (1.803 m)  Wt 185 lb (83.915 kg)  BMI 25.81 kg/m2  Review of Systems: See HPI above.    Objective:  Physical Exam:  Gen: NAD  Left ankle/foot: No swelling, bruising, other deformity. FROM with 5/5 strength. Minimal TTP over ATFL. Negative ant drawer and talar tilt.   Negative syndesmotic compression. Thompsons test negative.    Assessment & Plan:  1. Left ankle injury - 2/2 ankle sprain - doing well, improved.  Use ASO on irregular surfaces for next 4 weeks.  Continue home strengthening for another  4 weeks.  Icing, advil only if needed. Activities as tolerated.  F/u prn.

## 2014-01-20 NOTE — Assessment & Plan Note (Signed)
2/2 ankle sprain - doing well, improved.  Use ASO on irregular surfaces for next 4 weeks.  Continue home strengthening for another 4 weeks.  Icing, advil only if needed. Activities as tolerated.  F/u prn.

## 2014-08-14 ENCOUNTER — Encounter (HOSPITAL_COMMUNITY): Payer: Self-pay | Admitting: Emergency Medicine

## 2014-08-14 ENCOUNTER — Emergency Department (HOSPITAL_COMMUNITY)
Admission: EM | Admit: 2014-08-14 | Discharge: 2014-08-14 | Disposition: A | Discharge status: Home / Self Care | Payer: Commercial Managed Care - PPO | Attending: Emergency Medicine | Admitting: Emergency Medicine

## 2014-08-14 ENCOUNTER — Other Ambulatory Visit: Payer: Self-pay

## 2014-08-14 ENCOUNTER — Emergency Department (HOSPITAL_COMMUNITY): Payer: Commercial Managed Care - PPO

## 2014-08-14 DIAGNOSIS — E785 Hyperlipidemia, unspecified: Secondary | ICD-10-CM | POA: Diagnosis not present

## 2014-08-14 DIAGNOSIS — R000 Tachycardia, unspecified: Secondary | ICD-10-CM | POA: Insufficient documentation

## 2014-08-14 DIAGNOSIS — Z7982 Long term (current) use of aspirin: Secondary | ICD-10-CM | POA: Diagnosis not present

## 2014-08-14 DIAGNOSIS — R079 Chest pain, unspecified: Secondary | ICD-10-CM | POA: Diagnosis present

## 2014-08-14 DIAGNOSIS — Z7951 Long term (current) use of inhaled steroids: Secondary | ICD-10-CM | POA: Insufficient documentation

## 2014-08-14 DIAGNOSIS — Z79899 Other long term (current) drug therapy: Secondary | ICD-10-CM | POA: Diagnosis not present

## 2014-08-14 DIAGNOSIS — I10 Essential (primary) hypertension: Secondary | ICD-10-CM | POA: Diagnosis not present

## 2014-08-14 DIAGNOSIS — Z8709 Personal history of other diseases of the respiratory system: Secondary | ICD-10-CM | POA: Diagnosis not present

## 2014-08-14 DIAGNOSIS — K219 Gastro-esophageal reflux disease without esophagitis: Secondary | ICD-10-CM | POA: Diagnosis not present

## 2014-08-14 LAB — TROPONIN I

## 2014-08-14 LAB — CBC
HEMATOCRIT: 43.8 % (ref 39.0–52.0)
Hemoglobin: 14.5 g/dL (ref 13.0–17.0)
MCH: 29.5 pg (ref 26.0–34.0)
MCHC: 33.1 g/dL (ref 30.0–36.0)
MCV: 89 fL (ref 78.0–100.0)
Platelets: 276 10*3/uL (ref 150–400)
RBC: 4.92 MIL/uL (ref 4.22–5.81)
RDW: 12.2 % (ref 11.5–15.5)
WBC: 5.2 10*3/uL (ref 4.0–10.5)

## 2014-08-14 LAB — BASIC METABOLIC PANEL
Anion gap: 4 — ABNORMAL LOW (ref 5–15)
BUN: 21 mg/dL (ref 6–23)
CALCIUM: 9.2 mg/dL (ref 8.4–10.5)
CO2: 29 mmol/L (ref 19–32)
Chloride: 107 mmol/L (ref 96–112)
Creatinine, Ser: 1.09 mg/dL (ref 0.50–1.35)
GFR calc Af Amer: >90 mL/min (ref >90)
GFR, EST NON AFRICAN AMERICAN: 80 mL/min — AB (ref >90)
GLUCOSE: 106 mg/dL — AB (ref 70–99)
POTASSIUM: 4.1 mmol/L (ref 3.5–5.1)
SODIUM: 140 mmol/L (ref 135–145)

## 2014-08-14 LAB — D-DIMER, QUANTITATIVE (NOT AT ARMC)

## 2014-08-14 MED ORDER — ASPIRIN 81 MG PO CHEW
324.0000 mg | CHEWABLE_TABLET | Freq: Once | ORAL | Status: AC
  Administered 2014-08-14: 324 mg via ORAL
  Filled 2014-08-14: qty 4

## 2014-08-14 MED ORDER — SODIUM CHLORIDE 0.9 % IV BOLUS (SEPSIS)
500.0000 mL | Freq: Once | INTRAVENOUS | Status: AC
  Administered 2014-08-14: 500 mL via INTRAVENOUS

## 2014-08-14 MED ORDER — ASPIRIN 81 MG PO CHEW: 162.0000 mg | CHEWABLE_TABLET | Freq: Once | ORAL | Status: DC

## 2014-08-14 NOTE — ED Notes (Signed)
Pt from home c/o central chest pressure intermittently.  He reports sometimes feeling his heart race.

## 2014-08-14 NOTE — Discharge Instructions (Signed)
Chest Pain (Nonspecific) Follow up your pcp on Monday.  Take tylenol for pain. Return for chest pain, shortness of breath, diaphoresis. Talk with your pcp about a stress test.  It is often hard to give a specific diagnosis for the cause of chest pain. There is always a chance that your pain could be related to something serious, such as a heart attack or a blood clot in the lungs. You need to follow up with your health care provider for further evaluation. CAUSES   Heartburn.  Pneumonia or bronchitis.  Anxiety or stress.  Inflammation around your heart (pericarditis) or lung (pleuritis or pleurisy).  A blood clot in the lung.  A collapsed lung (pneumothorax). It can develop suddenly on its own (spontaneous pneumothorax) or from trauma to the chest.  Shingles infection (herpes zoster virus). The chest wall is composed of bones, muscles, and cartilage. Any of these can be the source of the pain.  The bones can be bruised by injury.  The muscles or cartilage can be strained by coughing or overwork.  The cartilage can be affected by inflammation and become sore (costochondritis). DIAGNOSIS  Lab tests or other studies may be needed to find the cause of your pain. Your health care provider may have you take a test called an ambulatory electrocardiogram (ECG). An ECG records your heartbeat patterns over a 24-hour period. You may also have other tests, such as:  Transthoracic echocardiogram (TTE). During echocardiography, sound waves are used to evaluate how blood flows through your heart.  Transesophageal echocardiogram (TEE).  Cardiac monitoring. This allows your health care provider to monitor your heart rate and rhythm in real time.  Holter monitor. This is a portable device that records your heartbeat and can help diagnose heart arrhythmias. It allows your health care provider to track your heart activity for several days, if needed.  Stress tests by exercise or by giving medicine  that makes the heart beat faster. TREATMENT   Treatment depends on what may be causing your chest pain. Treatment may include:  Acid blockers for heartburn.  Anti-inflammatory medicine.  Pain medicine for inflammatory conditions.  Antibiotics if an infection is present.  You may be advised to change lifestyle habits. This includes stopping smoking and avoiding alcohol, caffeine, and chocolate.  You may be advised to keep your head raised (elevated) when sleeping. This reduces the chance of acid going backward from your stomach into your esophagus. Most of the time, nonspecific chest pain will improve within 2-3 days with rest and mild pain medicine.  HOME CARE INSTRUCTIONS   If antibiotics were prescribed, take them as directed. Finish them even if you start to feel better.  For the next few days, avoid physical activities that bring on chest pain. Continue physical activities as directed.  Do not use any tobacco products, including cigarettes, chewing tobacco, or electronic cigarettes.  Avoid drinking alcohol.  Only take medicine as directed by your health care provider.  Follow your health care provider's suggestions for further testing if your chest pain does not go away.  Keep any follow-up appointments you made. If you do not go to an appointment, you could develop lasting (chronic) problems with pain. If there is any problem keeping an appointment, call to reschedule. SEEK MEDICAL CARE IF:   Your chest pain does not go away, even after treatment.  You have a rash with blisters on your chest.  You have a fever. SEEK IMMEDIATE MEDICAL CARE IF:   You have increased chest  pain or pain that spreads to your arm, neck, jaw, back, or abdomen.  You have shortness of breath.  You have an increasing cough, or you cough up blood.  You have severe back or abdominal pain.  You feel nauseous or vomit.  You have severe weakness.  You faint.  You have chills. This is an  emergency. Do not wait to see if the pain will go away. Get medical help at once. Call your local emergency services (911 in [place] Do not drive yourself to the hospital. MAKE SURE YOU:   Understand these instructions.  Will watch your condition.  Will get help right away if you are not doing well or get worse. Document Released: 01/10/2005 Document Revised: 04/07/2013 Document Reviewed: 11/06/2007 [facility] Patient Information 2015 [place], [place]. This information is not intended to replace advice given to you by your health care provider. Make sure you discuss any questions you have with your health care provider.

## 2014-08-14 NOTE — ED Provider Notes (Signed)
CSN: [id number]     Arrival date & time 08/14/14  0431 History   None    Chief Complaint  Patient presents with  . Chest Pain     (Consider location/radiation/quality/duration/timing/severity/associated sxs/prior Treatment) Patient is a 44 y.o. male presenting with chest pain. The history is provided by the patient. No language interpreter was used.  Chest Pain Associated symptoms: no abdominal pain, no diaphoresis, no dizziness, no fever and no shortness of breath   Mr. Lequita HaltMorgan is a 44 y.o male with a history of GERD, hyperlipidemia, and hypertension who presents with intermittent chest pain that he describes as pressure for the past couple of weeks.  2/10 pressure now. He states he has not been feeling well lately and went to his pcp on Monday.  His pcp thought it might be issues related to his gallbladder so he had an ultrasound done which was negative for any acute gallbladder issues.  He states this morning he was awoken by chest pressure and diaphoresis with shortness of breath and nausea.  He denies any fever, abdominal pain, vomiting or leg swelling.  He has had no recent long distance travel. His father had an MI at age 46.   Past Medical History  Diagnosis Date  . GERD (gastroesophageal reflux disease)   . Vertigo   . HTN (hypertension)   . HLD (hyperlipidemia)   . Allergic rhinitis   . Diverticulosis of colon (without mention of hemorrhage) 06/01/2013    CT Scan    Past Surgical History  Procedure Laterality Date  . Nasal septoplasty w/ turbinoplasty    . Vasectomy     Family History  Problem Relation Age of Onset  . Breast cancer Mother   . Heart disease Father   . Heart attack Father   . Heart attack Paternal Grandfather    History  Substance Use Topics  . Smoking status: Never Smoker   . Smokeless tobacco: Never Used  . Alcohol Use: Yes     Comment: Socially     Review of Systems  Constitutional: Negative for fever and diaphoresis.  Respiratory: Negative  for shortness of breath.   Cardiovascular: Positive for chest pain.  Gastrointestinal: Negative for abdominal pain.  Musculoskeletal: Negative for neck pain.  Neurological: Negative for dizziness, syncope and light-headedness.  All other systems reviewed and are negative.     Allergies  Sulfa antibiotics  Home Medications   Prior to Admission medications   Medication Sig Start Date End Date Taking? Authorizing Provider  aspirin EC 81 MG tablet Take 81 mg by mouth daily.   Yes Historical Provider, MD  atorvastatin (LIPITOR) 20 MG tablet Take 20 mg by mouth daily.     Yes Historical Provider, MD  fluticasone (FLONASE) 50 MCG/ACT nasal spray Place 1 spray into both nostrils daily.   Yes Historical Provider, MD  loratadine (CLARITIN) 10 MG tablet Take 10 mg by mouth daily.   Yes Historical Provider, MD  losartan (COZAAR) 50 MG tablet Take 50 mg by mouth daily.     Yes Historical Provider, MD  Multiple Vitamin (MULTIVITAMIN WITH MINERALS) TABS tablet Take 1 tablet by mouth daily.   Yes Historical Provider, MD  Multiple Vitamin (MULTIVITAMIN) capsule Take 1 capsule by mouth daily.     Yes Historical Provider, MD  pantoprazole (PROTONIX) 40 MG tablet Take 40 mg by mouth daily.     Yes Historical Provider, MD   BP 125/75 mmHg  Pulse 82  Temp(Src) 98.1 F (36.7 C) (Oral)  Resp 12  SpO2 98% Physical Exam  Constitutional: He is oriented to person, place, and time. He appears well-developed and well-nourished.  HENT:  Head: Normocephalic and atraumatic.  Eyes: Conjunctivae are normal.  Neck: Normal range of motion. Neck supple.  Cardiovascular: Regular rhythm and normal heart sounds.  Tachycardia present.   Pulmonary/Chest: Effort normal and breath sounds normal. No respiratory distress. He has no wheezes. He has no rales. He exhibits no tenderness.  Abdominal: Soft. There is no tenderness.  Musculoskeletal: Normal range of motion. He exhibits no edema.  Neurological: He is alert and  oriented to person, place, and time.  Skin: Skin is warm and dry.  Nursing note and vitals reviewed.   ED Course  Procedures (including critical care time) Labs Review Labs Reviewed  BASIC METABOLIC PANEL - Abnormal; Notable for the following:    Glucose, Bld 106 (*)    GFR calc non Af Amer 80 (*)    Anion gap 4 (*)    All other components within normal limits  CBC  TROPONIN I  D-DIMER, QUANTITATIVE    Imaging Review Dg Chest Port 1 View  08/14/2014   CLINICAL DATA:  Subacute onset of chest pain and cough for 2 weeks. Tachycardia. Initial encounter.  EXAM: PORTABLE CHEST - 1 VIEW  COMPARISON:  Chest radiograph performed 10/04/2010  FINDINGS: The lungs are well-aerated. Pulmonary vascularity is at the upper limits of normal. There is no evidence of focal opacification, pleural effusion or pneumothorax.  The cardiomediastinal silhouette is within normal limits. No acute osseous abnormalities are seen.  IMPRESSION: No acute cardiopulmonary process seen.   Electronically Signed   By: Roanna Raider M.D.   On: 08/14/2014 05:47    EKG interpretation  Vent rate 78 bpm PR interval QRS duration 76ms QT/QTc 364/466ms P-R-T axes 74 84 76 Sinus rhythm with sinus arrhythmia with junctional escape complexes but otherwise normal.   I have reviewed and agree with this EKG.     MDM   Final diagnoses:  Chest pain, unspecified chest pain type  Patient has a history of HTN and hyperlipidemia who presents for chest pain that radiates into his back and he describes it as chest pressure. He has had this for several weeks and was evaluated by his pcp on Monday who thought it might be gallbladder related but work up was normal. He was awoken by chest pressure this morning and felt diaphoretic with nausea and shortness of breath. I gave him  aspirin in the ED.  He had an ECHO and stress test 3 years ago by his cardiologist Dr. Cherylynn Ridges which was normal.  I reviewed the heart score and he  is at low risk for major cardiac event.  His heart rate has come down with fluids.  Due to tachycardia I ordered a d-dimer which was normal. His chest xray is negative for pneumonia or edema.    I feel comfortable discharging him since his vitals are stable and he is following up with his pcp on Monday.  I discussed having his pcp or cardiologist order a stress test since it has been 3 years.  He agrees with the plan and will take tylenol or motrin for pain.  I discussed return precautions such as increased chest pain, radiation of pain, shortness of breath, or vomiting.       Catha Gosselin, PA-C 08/14/14 1112  Mancel Bale, MD 08/15/14 [id number]

## 2014-08-14 NOTE — ED Notes (Signed)
RN is starting IV and will draw labs from line

## 2014-08-17 ENCOUNTER — Other Ambulatory Visit (HOSPITAL_COMMUNITY): Payer: Self-pay | Admitting: Internal Medicine

## 2014-08-17 DIAGNOSIS — R1084 Generalized abdominal pain: Secondary | ICD-10-CM

## 2014-08-17 DIAGNOSIS — R079 Chest pain, unspecified: Secondary | ICD-10-CM

## 2014-08-31 ENCOUNTER — Ambulatory Visit (HOSPITAL_COMMUNITY): Payer: PRIVATE HEALTH INSURANCE

## 2014-09-15 ENCOUNTER — Ambulatory Visit: Payer: Self-pay | Admitting: Internal Medicine

## 2014-10-12 ENCOUNTER — Ambulatory Visit: Payer: Self-pay | Admitting: Internal Medicine

## 2015-02-15 IMAGING — CR DG ANKLE COMPLETE 3+V*L*
3 series · 3 of 3 positions shown · non-contrast
Comparison: None.

CLINICAL DATA: Lateral left ankle pain following an injury.

EXAM:
LEFT ANKLE COMPLETE - 3+ VIEW

[t ankle joint ap left]
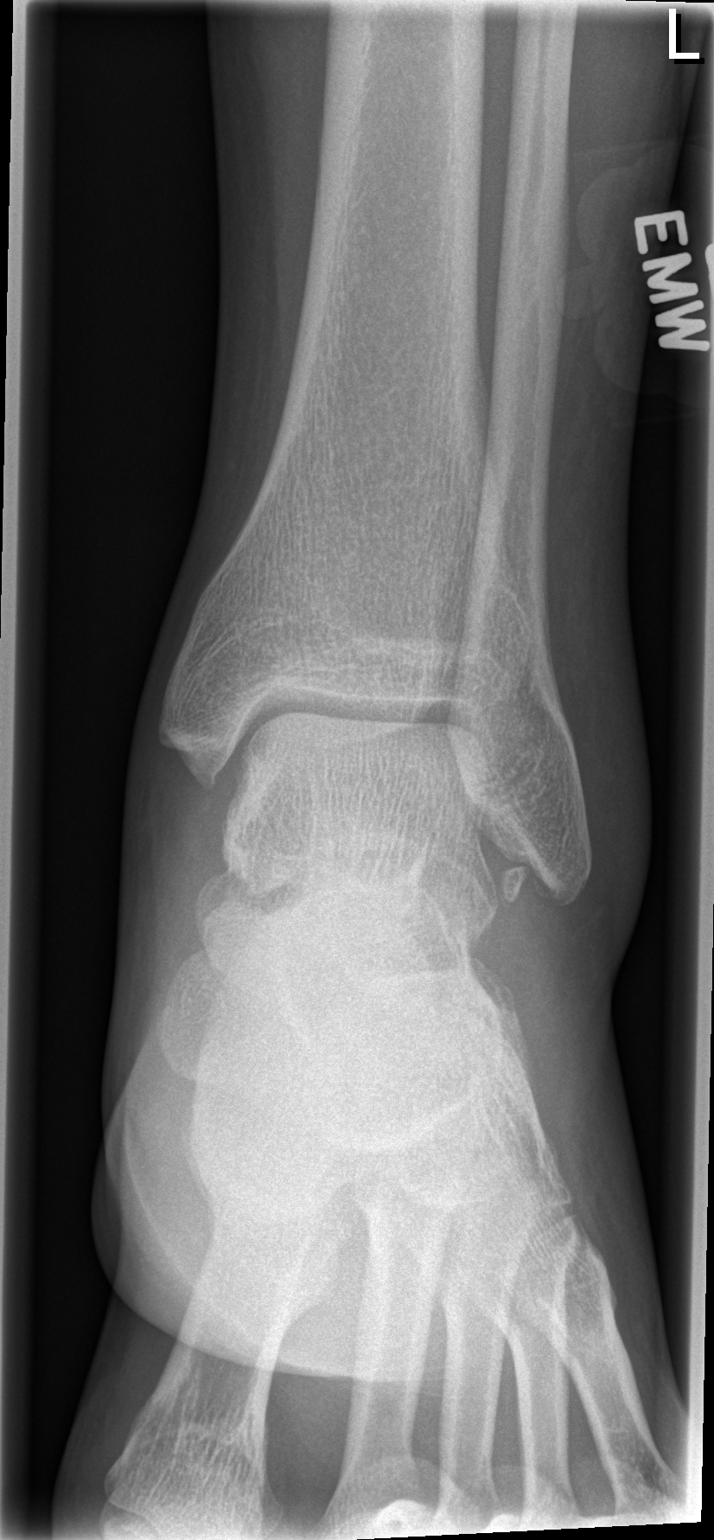

[t ankle joint oblique left]
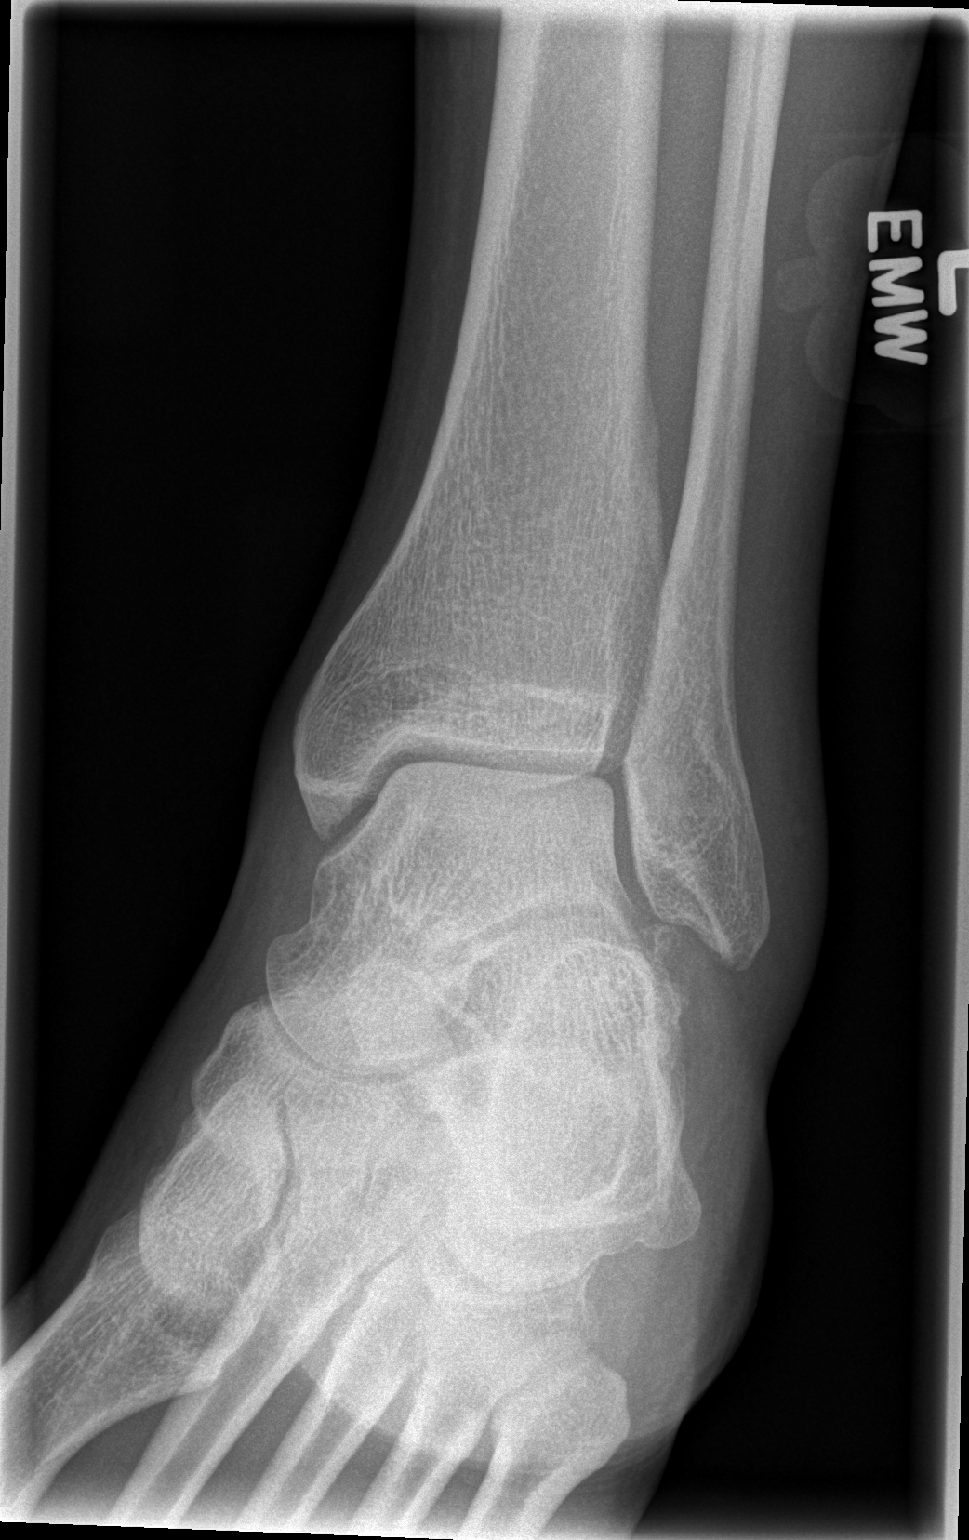

[t ankle joint lat left]
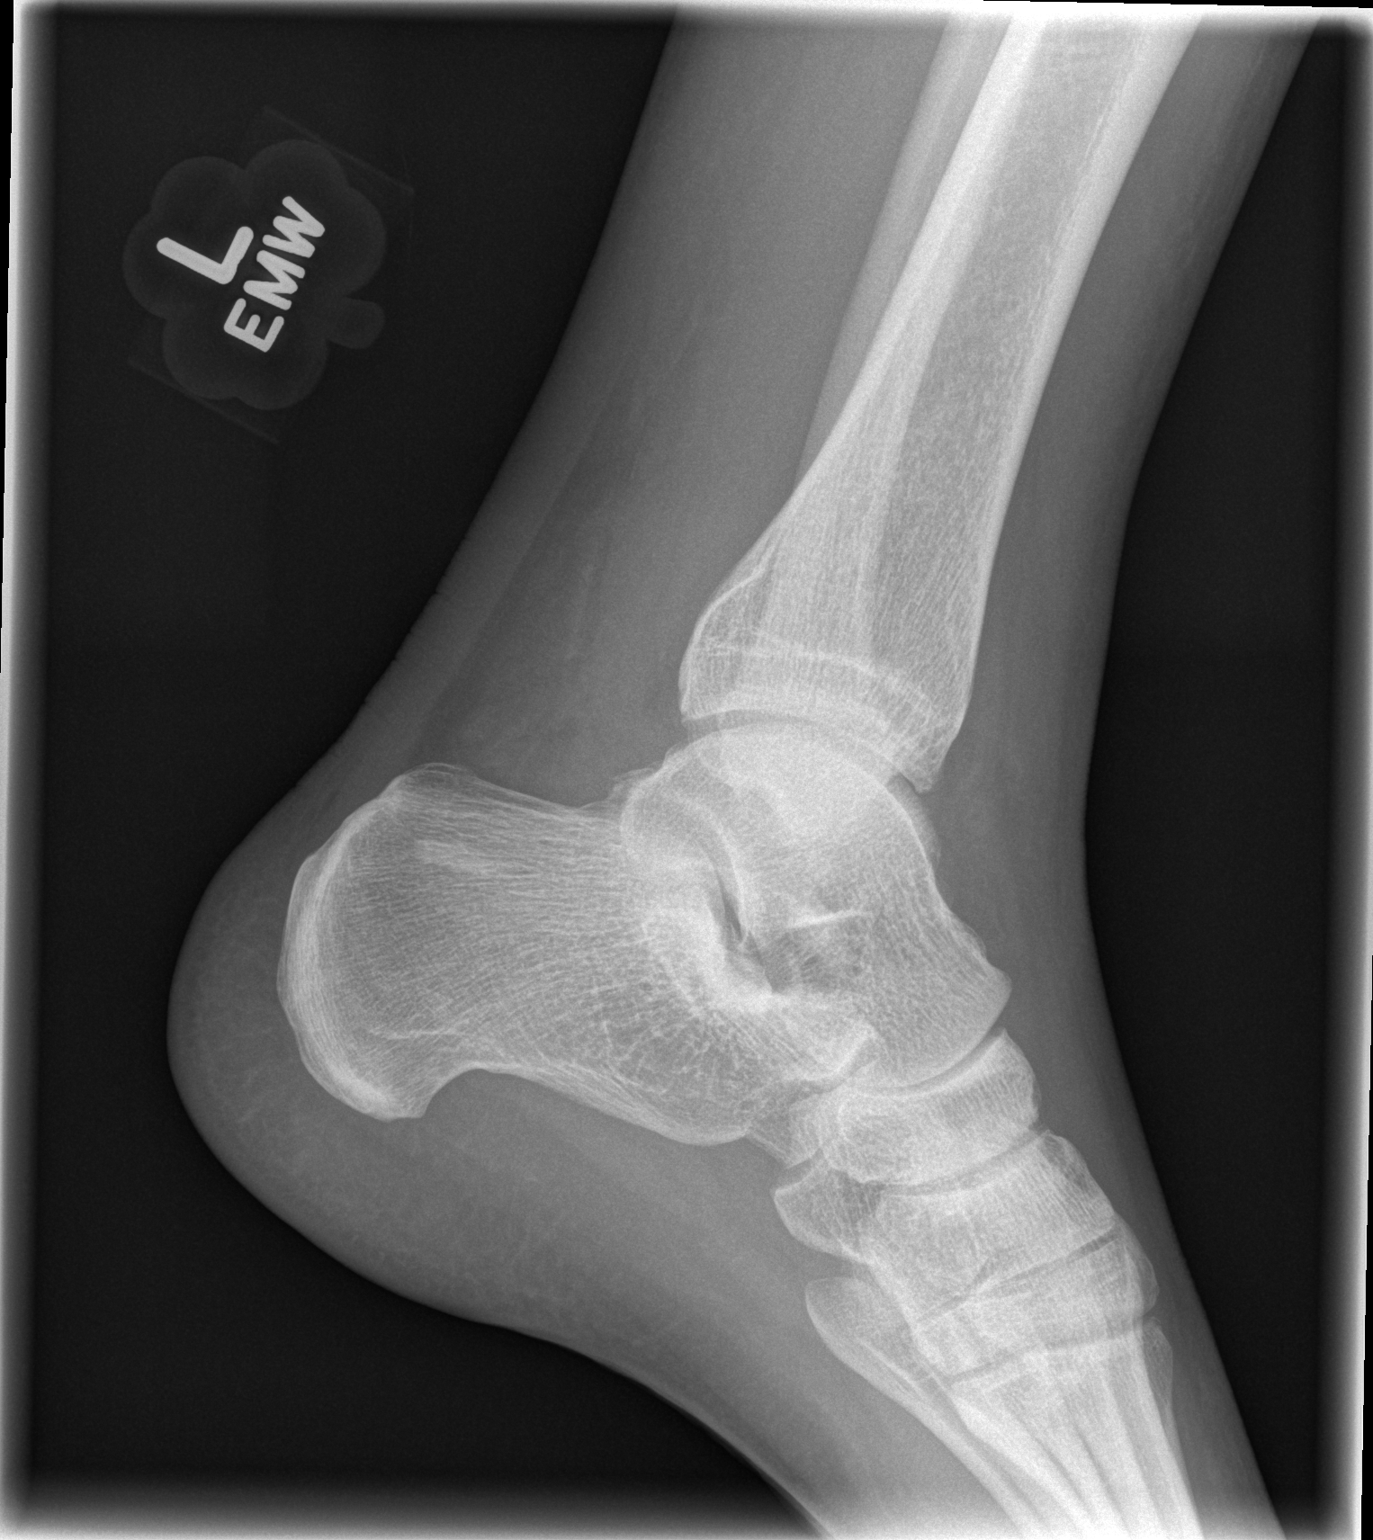

[3 of 3 positions shown; findings below may reference images not displayed]

FINDINGS: Small linear avulsion fracture off the medial aspect of the distal
lateral malleolus. There is also an adjacent larger bone fragment
that appears corticated. Associated soft tissue swelling. No
effusion seen.
IMPRESSION: 1. Small, acute avulsion fracture off the medial aspect of the
distal lateral malleolus.
2. Additional corticated bone fragment in that region, most likely
due to a previous injury.

## 2015-05-19 ENCOUNTER — Other Ambulatory Visit: Payer: Self-pay | Admitting: Internal Medicine

## 2015-05-19 DIAGNOSIS — G44009 Cluster headache syndrome, unspecified, not intractable: Secondary | ICD-10-CM

## 2015-05-25 ENCOUNTER — Ambulatory Visit
Admission: RE | Admit: 2015-05-25 | Discharge: 2015-05-25 | Disposition: A | Discharge status: Home / Self Care | Payer: 59 | Source: Ambulatory Visit | Attending: Internal Medicine | Admitting: Internal Medicine

## 2015-05-25 DIAGNOSIS — G44009 Cluster headache syndrome, unspecified, not intractable: Secondary | ICD-10-CM

## 2015-05-25 MED ORDER — GADOBENATE DIMEGLUMINE 529 MG/ML IV SOLN: 16.0000 mL | Freq: Once | INTRAVENOUS | Status: DC | PRN

## 2015-10-17 ENCOUNTER — Encounter: Payer: Self-pay | Admitting: Physician Assistant

## 2015-10-17 ENCOUNTER — Ambulatory Visit: Payer: 59 | Admitting: Physician Assistant

## 2015-10-19 ENCOUNTER — Ambulatory Visit (INDEPENDENT_AMBULATORY_CARE_PROVIDER_SITE_OTHER): Payer: 59 | Admitting: Physician Assistant

## 2015-10-19 ENCOUNTER — Encounter: Payer: Self-pay | Admitting: Physician Assistant

## 2015-10-19 ENCOUNTER — Encounter (INDEPENDENT_AMBULATORY_CARE_PROVIDER_SITE_OTHER): Payer: Self-pay

## 2015-10-19 VITALS — BP 132/80 | HR 86 | Ht 71.0 in | Wt 184.6 lb

## 2015-10-19 DIAGNOSIS — K219 Gastro-esophageal reflux disease without esophagitis: Secondary | ICD-10-CM

## 2015-10-19 DIAGNOSIS — R0609 Other forms of dyspnea: Secondary | ICD-10-CM | POA: Diagnosis not present

## 2015-10-19 DIAGNOSIS — R002 Palpitations: Secondary | ICD-10-CM

## 2015-10-19 DIAGNOSIS — R079 Chest pain, unspecified: Secondary | ICD-10-CM | POA: Diagnosis not present

## 2015-10-19 DIAGNOSIS — R0600 Dyspnea, unspecified: Secondary | ICD-10-CM

## 2015-10-19 DIAGNOSIS — I10 Essential (primary) hypertension: Secondary | ICD-10-CM

## 2015-10-19 DIAGNOSIS — E785 Hyperlipidemia, unspecified: Secondary | ICD-10-CM

## 2015-10-19 LAB — TSH: TSH: 2.01 m[IU]/L (ref 0.40–4.50)

## 2015-10-19 NOTE — Progress Notes (Signed)
Cardiology Office Note    Date:  10/19/2015   ID:  Gerald Armstrong, DOB 1970/08/18, MRN [id number]  PCP:  Minda MeoARONSON,Syaire A, MD  Cardiologist:  New - Previously seen by Dr. Clifton JamesMcAlhany in 2012  Chief Complaint  Patient presents with  . New Patient (Initial Visit)    seen for Dr. Clifton JamesMcAlhany, Nicki Guadalajarareestblish after last seen by Dr. Clifton JamesMcAlhany in 01/2011    History of Present Illness:  Gerald Armstrong is a 45 y.o. male with PMH of HTN, HLD and vertigo. He was last seen by Dr. Clifton JamesMcAlhany in October 2012 for dizziness. Prior to that he had a stress test in 2008 that was negative. Due to atypical and typical feature, outpatient ETT was obtained on 02/08/2011, patient exercised 12 minutes with no chest pain with exercise, no ischemic EKG changes, he had isolated PVC with exercise. Echocardiogram obtained on the same day showed EF 55-60%, no regional wall motion abnormality, grade 2 diastolic dysfunction. It appears he did go to the ED in April 2016 for chest pain, it was felt that his symptom was at low risk for major cardiac event. He was discharged from the ED to follow-up with his PCP outpatient setting.   He presents today for cardiology office visit. Per PCP note, he did have a TSH that was normal in February. At that time he was feeling well. For the past several month, he has noticed occasional palpitation and night when his heart suddenly jumps. He always had some degree of intermittent chest discomfort for the past several years occurring at rest. This atypical chest pain tend to occur both on the left side and right side of the chest and is not related to exertion. He usually ignores it. A week and a half ago, he was playing golf on a weekend, when he was walking up a slope, he started noticing chest discomfort and shortness of breath with exertion. He went running on the following week and had similar symptoms. The exertional component concern him. Interestingly enough, he went rope climbing subsequently  with his son and had no problem doing those activity without any further chest pain or dyspnea on exertion. He went to his PCPs office who recommended him to follow-up with cardiology saw office for further evaluation.  I have discussed with the Dr. Ladona Ridgelaylor, DOD, given the inconsistency of his symptom, we will obtain a ETT to further assess the chest discomfort and short dyspnea on exertion. We will also obtain a 30 day event monitor as well. We will draw a TSH today to reevaluate thyroid given palpitation symptom.    Past Medical History  Diagnosis Date  . GERD (gastroesophageal reflux disease)   . Vertigo   . HTN (hypertension)   . HLD (hyperlipidemia)   . Allergic rhinitis   . Diverticulosis of colon (without mention of hemorrhage) 06/01/2013    CT Scan     Past Surgical History  Procedure Laterality Date  . Nasal septoplasty w/ turbinoplasty    . Vasectomy      Current Medications: Outpatient Prescriptions Prior to Visit  Medication Sig Dispense Refill  . aspirin EC 81 MG tablet Take 81 mg by mouth daily.    Marland Kitchen. atorvastatin (LIPITOR) 20 MG tablet Take 20 mg by mouth daily.      Marland Kitchen. loratadine (CLARITIN) 10 MG tablet Take 10 mg by mouth daily.    Marland Kitchen. losartan (COZAAR) 50 MG tablet Take 50 mg by mouth daily.      . Multiple  Vitamin (MULTIVITAMIN WITH MINERALS) TABS tablet Take 1 tablet by mouth daily.    . pantoprazole (PROTONIX) 40 MG tablet Take 40 mg by mouth daily.      . fluticasone (FLONASE) 50 MCG/ACT nasal spray Place 1 spray into both nostrils daily. Reported on 10/19/2015    . Multiple Vitamin (MULTIVITAMIN) capsule Take 1 capsule by mouth daily. Reported on 10/19/2015     No facility-administered medications prior to visit.     Allergies:   Sulfa antibiotics   Social History   Social History  . Marital Status: Married    Spouse Name: N/A  . Number of Children: 2  . Years of Education: N/A   Occupational History  . [employer]    Social History Main Topics    . Smoking status: Never Smoker   . Smokeless tobacco: Never Used  . Alcohol Use: Yes     Comment: Socially   . Drug Use: No  . Sexual Activity: Not Asked   Other Topics Concern  . None   Social History Narrative   Patient gets regular exercise      Family History:  The patient's family history includes Breast cancer in his mother; Heart attack in his father and paternal grandfather; Heart disease in his father.   ROS:   Please see the history of present illness.    ROS All other systems reviewed and are negative.   PHYSICAL EXAM:   VS:  BP 132/80 mmHg  Pulse 86  Ht  (1.803 m)  Wt 184 lb 9.6 oz (83.734 kg)  BMI 25.76 kg/m2   GEN: Well nourished, well developed, in no acute distress HEENT: normal Neck: no JVD, carotid bruits, or masses Cardiac: RRR; no murmurs, rubs, or gallops,no edema  Respiratory:  clear to auscultation bilaterally, normal work of breathing GI: soft, nontender, nondistended, + BS MS: no deformity or atrophy Skin: warm and dry, no rash Neuro:  Alert and Oriented x 3, Strength and sensation are intact Psych: euthymic mood, full affect  Wt Readings from Last 3 Encounters:  10/19/15 184 lb 9.6 oz (83.734 kg)  01/19/14 185 lb (83.915 kg)  01/04/14 183 lb (83.008 kg)      Studies/Labs Reviewed:   EKG:  EKG is ordered today.  The ekg ordered today demonstrates NSR without significant ST-T wave changes  Recent Labs: No results found for requested labs within last 365 days.   Lipid Panel No results found for: CHOL, TRIG, HDL, CHOLHDL, VLDL, LDLCALC, LDLDIRECT  Additional studies/ records that were reviewed today include:   Echo 02/08/2011 LV EF: 55% -  60%  ------------------------------------------------------------ Indications:   Chest pain 786.51.  ------------------------------------------------------------ History:  PMH: Acquired from the patient and from the patient's chart. Chest pain. Vertigo. No prior cardiac history.  Risk factors: Family history of coronary artery disease. Hypertension. Dyslipidemia.  ------------------------------------------------------------ Study Conclusions  Left ventricle: The cavity size was normal. Wall thickness was normal. Systolic function was normal. The estimated ejection fraction was in the range of 55% to 60%. Wall motion was normal; there were no regional wall motion abnormalities. Features are consistent with a pseudonormal left ventricular filling pattern, with concomitant abnormal relaxation and increased filling pressure (grade 2 diastolic dysfunction).        ETT 02/08/2011 ETT Interpretation: normal - no evidence of ischemia by ST analysis  Comments: Pt exercised for 12 minutes. He had no chest pain with exercise. There were no ischemic EKG changes. He had an isolated PVC with  exercise.   Recommendations: No further cardiac workup at this time.     ASSESSMENT:    1. CHEST PAIN   2. Gastroesophageal reflux disease, esophagitis presence not specified   3. DOE (dyspnea on exertion)   4. Palpitation   5. Essential hypertension   6. Hyperlipidemia      PLAN:  In order of problems listed above:  1. Chest pain and DOE: some typical and atypical feature, he has chronic chest pain, but noticed more exertional component in the last 2 weeks, interestingly it is not consistent, as he just went rope climbing with his son and had no symptom. Will obtain ETT for now. Since patient has not been seen by MD in > 3 years, he was seen along with DOD Dr. Ladona Ridgelaylor, who was agreeable to this plan  2. Palpitation: increasing in frequency in the last several month, tend to occur at rest, described as sudden increase in HR, will obtain TSH and 30 day event monitor.   3. HTN: BP only mildly elevated with SBP 132 today, will continue current dose of losartan. If event monitor does show tachypalpitation corresponding with underlying rhythm, will then add 12.5mg  BID of  metoprolol.    4. HLD: continue lipitor    Medication Adjustments/Labs and Tests Ordered: Current medicines are reviewed at length with the patient today.  Concerns regarding medicines are outlined above.  Medication changes, Labs and Tests ordered today are listed in the Patient Instructions below. Patient Instructions  Medication Instructions:  None  Labwork: TSH today  Testing/Procedures: Your physician has requested that you have an exercise tolerance test. For further information please visit [email or web address]. Please also follow instruction sheet, as given.  Your physician has recommended that you wear an event monitor. Event monitors are medical devices that record the heart's electrical activity. Doctors most often us these monitors to diagnose arrhythmias. Arrhythmias are problems with the speed or rhythm of the heartbeat. The monitor is a small, portable device. You can wear one while you do your normal daily activities. This is usually used to diagnose what is causing palpitations/syncope (passing out).   Follow-Up: Your physician wants you to follow-up in: 3 months with Dr. Clifton JamesMcAlhany.  You will receive a reminder letter in the mail two months in advance. If you don't receive a letter, please call our office to schedule the follow-up appointment.   Any Other Special Instructions Will Be Listed Below (If Applicable).     If you need a refill on your cardiac medications before your next appointment, please call your pharmacy.       Ramond DialSigned, Kaylani Fromme, [place]  10/19/2015 7:37 PM    [facility] [address], [place], [place]  [address] Phone: [phone]; Fax: [phone]) [phone]   Cardiology Attending  Patient seen and examined. Agree with above plan. For ETT.   Leonia ReevesGregg Taylor,M.D.

## 2015-10-19 NOTE — Patient Instructions (Signed)
Medication Instructions:  None  Labwork: TSH today  Testing/Procedures: Your physician has requested that you have an exercise tolerance test. For further information please visit [email or web address]. Please also follow instruction sheet, as given.  Your physician has recommended that you wear an event monitor. Event monitors are medical devices that record the heart's electrical activity. Doctors most often us these monitors to diagnose arrhythmias. Arrhythmias are problems with the speed or rhythm of the heartbeat. The monitor is a small, portable device. You can wear one while you do your normal daily activities. This is usually used to diagnose what is causing palpitations/syncope (passing out).   Follow-Up: Your physician wants you to follow-up in: 3 months with Dr. Clifton JamesMcAlhany.  You will receive a reminder letter in the mail two months in advance. If you don't receive a letter, please call our office to schedule the follow-up appointment.   Any Other Special Instructions Will Be Listed Below (If Applicable).     If you need a refill on your cardiac medications before your next appointment, please call your pharmacy.

## 2015-10-31 ENCOUNTER — Ambulatory Visit (INDEPENDENT_AMBULATORY_CARE_PROVIDER_SITE_OTHER): Payer: 59

## 2015-10-31 DIAGNOSIS — R002 Palpitations: Secondary | ICD-10-CM | POA: Diagnosis not present

## 2015-11-14 ENCOUNTER — Encounter (INDEPENDENT_AMBULATORY_CARE_PROVIDER_SITE_OTHER): Payer: Self-pay

## 2015-11-14 ENCOUNTER — Ambulatory Visit (INDEPENDENT_AMBULATORY_CARE_PROVIDER_SITE_OTHER): Payer: 59

## 2015-11-14 DIAGNOSIS — R0600 Dyspnea, unspecified: Secondary | ICD-10-CM

## 2015-11-14 DIAGNOSIS — R002 Palpitations: Secondary | ICD-10-CM | POA: Diagnosis not present

## 2015-11-14 DIAGNOSIS — R079 Chest pain, unspecified: Secondary | ICD-10-CM | POA: Diagnosis not present

## 2015-11-14 DIAGNOSIS — R0609 Other forms of dyspnea: Secondary | ICD-10-CM | POA: Diagnosis not present

## 2015-11-14 LAB — EXERCISE TOLERANCE TEST
CHL CUP MPHR: 173 {beats}/min
CHL CUP RESTING HR STRESS: 87 {beats}/min
CHL RATE OF PERCEIVED EXERTION: 15
CSEPPHR: 179 {beats}/min
Estimated workload: 13.7 METS
Exercise duration (min): 12 min
Exercise duration (sec): 0 s
Percent HR: 103 %

## 2015-12-05 ENCOUNTER — Telehealth: Payer: Self-pay | Admitting: *Deleted

## 2015-12-05 MED ORDER — METOPROLOL SUCCINATE ER 25 MG PO TB24: 25.0000 mg | ORAL_TABLET | Freq: Every day | ORAL | 11 refills | Status: DC

## 2015-12-05 NOTE — Telephone Encounter (Signed)
Spoke with pt and reviewed monitor results and recommendations from Dr. Clifton JamesMcAlhany with him. [facility]. Appt made for pt to see Dr. Clifton JamesMcAlhany on January 02, 2016 at 9:15.

## 2015-12-05 NOTE — Telephone Encounter (Signed)
New Message ° °Pt returning call to RN. Please call back to discuss  °

## 2015-12-05 NOTE — Telephone Encounter (Signed)
-----   Message from Kathleene Hazelhristopher D McAlhany, MD sent at 12/05/2015 10:33 AM EDT ----- Dennie BiblePat, He had one run of SVT. Can we let him know that this is benign but could make him feel dyspneic if his heart rate speeds up. We can start Toprol 25 mg daily. I would like to see him or have an office APP see him in 3-4 weeks. Thanks, chris

## 2015-12-05 NOTE — Telephone Encounter (Signed)
I placed call to pt and left message to call back.

## 2015-12-08 ENCOUNTER — Telehealth: Payer: Self-pay

## 2015-12-08 NOTE — Telephone Encounter (Signed)
Dr. Clifton JamesMcAlhany already reviewed monitor results and started patient on Metoprolol 25 mg. Patient does not need to increase Metoprolol due to patient just started on this medication.

## 2015-12-08 NOTE — Telephone Encounter (Signed)
-----   Message from BosticHao Meng, [place] sent at 12/07/2015  3:19 PM EDT ----- Monitor picked up an episode of fast heart rate called supraventricular tachycardia when he activated for SOB, will increase toprol XL to 50mg  daily in order to suppress further fast HR. Will decrease losartan to 25 mg daily.

## 2015-12-15 ENCOUNTER — Encounter: Payer: Self-pay | Admitting: Cardiovascular Disease

## 2015-12-15 ENCOUNTER — Other Ambulatory Visit: Payer: Self-pay | Admitting: *Deleted

## 2015-12-15 NOTE — Progress Notes (Signed)
Called patient who told me that I am the third person to call him with results.  He has also reviewed this with his PCP.  Losartan has been cut to 25 mg daily and he is taking Toprol XL 25 mg.  He has follow up with Dr. Clifton JamesMcAlhany on 9/18.

## 2016-01-02 ENCOUNTER — Encounter: Payer: Self-pay | Admitting: Cardiovascular Disease

## 2016-01-02 ENCOUNTER — Ambulatory Visit (INDEPENDENT_AMBULATORY_CARE_PROVIDER_SITE_OTHER): Payer: 59 | Admitting: Cardiovascular Disease

## 2016-01-02 VITALS — BP 118/76 | HR 85 | Ht 71.0 in | Wt 181.0 lb

## 2016-01-02 DIAGNOSIS — R072 Precordial pain: Secondary | ICD-10-CM | POA: Diagnosis not present

## 2016-01-02 DIAGNOSIS — I471 Supraventricular tachycardia: Secondary | ICD-10-CM

## 2016-01-02 DIAGNOSIS — I10 Essential (primary) hypertension: Secondary | ICD-10-CM | POA: Diagnosis not present

## 2016-01-02 LAB — BASIC METABOLIC PANEL
BUN: 19 mg/dL (ref 7–25)
CHLORIDE: 101 mmol/L (ref 98–110)
CO2: 27 mmol/L (ref 20–31)
Calcium: 9.7 mg/dL (ref 8.6–10.3)
Creat: 0.98 mg/dL (ref 0.60–1.35)
Glucose, Bld: 100 mg/dL — ABNORMAL HIGH (ref 65–99)
POTASSIUM: 4.6 mmol/L (ref 3.5–5.3)
SODIUM: 138 mmol/L (ref 135–146)

## 2016-01-02 NOTE — Progress Notes (Signed)
Chief Complaint  Patient presents with  . Palpitations     History of Present Illness: 45 yo WM with h/o HTN, HLD, vertigo here today for cardiac follow up. I met him in 2012 when he had c/o dizziness after exercising. Exercise stress test October 2012 with no ischemia,. Echo 2012 with normal LV systolic function, no valvular disease. He was seen in our office July 2017 by Almyra Deforest PA-C with c/o chest pain that was felt to be atypical and palpitations. Exercise stress test 11/14/15 with no ischemia. 30 day event monitor July 2017 with sinus, rare PVCs and one run of SVT. Toprol was started.   He is here today for follow up.   Primary Care Physician: Geoffery Lyons, MD  Past Medical History:  Diagnosis Date  . Allergic rhinitis   . Diverticulosis of colon (without mention of hemorrhage) 06/01/2013   CT Scan   . GERD (gastroesophageal reflux disease)   . HLD (hyperlipidemia)   . HTN (hypertension)   . SVT (supraventricular tachycardia) ([place])   . Vertigo     Past Surgical History:  Procedure Laterality Date  . NASAL SEPTOPLASTY W/ TURBINOPLASTY    . VASECTOMY      Current Outpatient Prescriptions  Medication Sig Dispense Refill  . aspirin EC 81 MG tablet Take 81 mg by mouth daily.    Marland Kitchen atorvastatin (LIPITOR) 20 MG tablet Take 20 mg by mouth daily.      Marland Kitchen loratadine (CLARITIN) 10 MG tablet Take 10 mg by mouth daily.    Marland Kitchen losartan (COZAAR) 50 MG tablet Take 25 mg by mouth daily.      . metoprolol succinate (TOPROL XL) 25 MG 24 hr tablet Take 1 tablet (25 mg total) by mouth daily. 30 tablet 11  . Multiple Vitamin (MULTIVITAMIN WITH MINERALS) TABS tablet Take 1 tablet by mouth daily.    . pantoprazole (PROTONIX) 40 MG tablet Take 40 mg by mouth daily.       No current facility-administered medications for this visit.     Allergies  Allergen Reactions  . Sulfa Antibiotics Other (See Comments)    Rash and hives.    Social History   Social History  . Marital status:  Married    Spouse name: N/A  . Number of children: 2  . Years of education: N/A   Occupational History  . [employer]    Social History Main Topics  . Smoking status: Never Smoker  . Smokeless tobacco: Never Used  . Alcohol use Yes     Comment: Socially   . Drug use: No  . Sexual activity: Not on file   Other Topics Concern  . Not on file   Social History Narrative   Patient gets regular exercise     Family History  Problem Relation Age of Onset  . Breast cancer Mother   . Heart disease Father   . Heart attack Father   . Heart attack Paternal Grandfather     Review of Systems:  As stated in the HPI and otherwise negative.   BP 118/76   Pulse 85   Ht 5' 11"  (1.803 m)   Wt 181 lb (82.1 kg)   BMI 25.24 kg/m   Physical Examination: General: Well developed, well nourished, NAD  HEENT: OP clear, mucus membranes moist  SKIN: warm, dry. No rashes. Neuro: No focal deficits  Musculoskeletal: Muscle strength 5/5 all ext  Psychiatric: Mood and affect normal  Neck: No JVD, no carotid bruits,  no thyromegaly, no lymphadenopathy.  Lungs:Clear bilaterally, no wheezes, rhonci, crackles Cardiovascular: Regular rate and rhythm. No murmurs, gallops or rubs. Abdomen:Soft. Bowel sounds present. Non-tender.  Extremities: No lower extremity edema. Pulses are 2 + in the bilateral DP/PT.  EKG:  EKG is ordered today. The ekg ordered today demonstrates NSR, rate 85 bpm  Recent Labs: 10/19/2015: TSH 2.01     Wt Readings from Last 3 Encounters:  01/02/16 181 lb (82.1 kg)  10/19/15 184 lb 9.6 oz (83.7 kg)  01/19/14 185 lb (83.9 kg)     Other studies Reviewed: Additional studies/ records that were reviewed today include: . Review of the above records demonstrates:    Assessment and Plan:   1. SVT: No recurrence of palpitations. Continue beta blocker.   2. Chest pain:  He has chest pain and pressure, dyspnea with exertion. He has a family history of CAD and has a  personal history of hyperlipidemia and HTN. His stress test did not show ischemic EKG changes but given his symptoms and risk factors for CAD, I think it is important to exclude obstructive CAD as the cause for his chest pain. We have discussed cardiac cath vs coronary CTA. He would prefer to go with the less invasive approach and I agree with this. Will arrange Coronary CTA. Check BMET today.    3. HTN: BP is controlled. No changes.    Current medicines are reviewed at length with the patient today.  The patient does not have concerns regarding medicines.  The following changes have been made:  no change  Labs/ tests ordered today include:   Orders Placed This Encounter  Procedures  . CT Heart Morp W/Cta Cor W/Score W/Ca W/Cm &/Or Wo/Cm  . Basic Metabolic Panel (BMET)  . EKG 12-Lead    Disposition:   FU with me in 12 months  Signed, Lauree Chandler, MD 01/02/2016 10:20 AM    [facility] [place], [place], [place]  [address] Phone: [phone]; Fax: [phone]

## 2016-01-02 NOTE — Patient Instructions (Signed)
Medication Instructions:  Your physician recommends that you continue on your current medications as directed. Please refer to the Current Medication list given to you today.   Labwork: Lab work to be done today-BMP  Testing/Procedures: Your physician has requested that you have cardiac CT. Cardiac computed tomography (CT) is a painless test that uses an x-ray machine to take clear, detailed pictures of your heart. For further information please visit [email or web address]. Please follow instruction sheet as given.    Follow-Up: Your physician wants you to follow-up in: 12 months.  You will receive a reminder letter in the mail two months in advance. If you don't receive a letter, please call our office to schedule the follow-up appointment.   Any Other Special Instructions Will Be Listed Below (If Applicable).     If you need a refill on your cardiac medications before your next appointment, please call your pharmacy.   

## 2016-01-03 ENCOUNTER — Encounter: Payer: Self-pay | Admitting: Cardiovascular Disease

## 2016-01-06 ENCOUNTER — Ambulatory Visit: Payer: 59 | Admitting: Cardiovascular Disease

## 2016-01-12 ENCOUNTER — Encounter (HOSPITAL_COMMUNITY): Payer: Self-pay

## 2016-01-12 ENCOUNTER — Ambulatory Visit (HOSPITAL_COMMUNITY)
Admission: RE | Admit: 2016-01-12 | Discharge: 2016-01-12 | Disposition: A | Discharge status: Home / Self Care | Payer: 59 | Source: Ambulatory Visit | Attending: Cardiovascular Disease | Admitting: Cardiovascular Disease

## 2016-01-12 DIAGNOSIS — R072 Precordial pain: Secondary | ICD-10-CM | POA: Insufficient documentation

## 2016-01-12 MED ORDER — METOPROLOL TARTRATE 5 MG/5ML IV SOLN
INTRAVENOUS | Status: AC
  Administered 2016-01-12: 5 mg via INTRAVENOUS
  Filled 2016-01-12: qty 5

## 2016-01-12 MED ORDER — IOPAMIDOL (ISOVUE-370) INJECTION 76%
INTRAVENOUS | Status: AC
  Administered 2016-01-12: 80 mL
  Filled 2016-01-12: qty 100

## 2016-01-12 MED ORDER — METOPROLOL TARTRATE 5 MG/5ML IV SOLN
5.0000 mg | Freq: Once | INTRAVENOUS | Status: AC
  Administered 2016-01-12: 5 mg via INTRAVENOUS

## 2016-01-12 MED ORDER — NITROGLYCERIN 0.4 MG SL SUBL
SUBLINGUAL_TABLET | SUBLINGUAL | Status: AC
  Filled 2016-01-12: qty 2

## 2016-01-12 MED ORDER — NITROGLYCERIN 0.4 MG SL SUBL
0.8000 mg | SUBLINGUAL_TABLET | Freq: Once | SUBLINGUAL | Status: AC
  Administered 2016-01-12: 0.8 mg via SUBLINGUAL

## 2016-10-24 DIAGNOSIS — M5416 Radiculopathy, lumbar region: Secondary | ICD-10-CM | POA: Diagnosis not present

## 2016-10-24 DIAGNOSIS — L245 Irritant contact dermatitis due to other chemical products: Secondary | ICD-10-CM | POA: Diagnosis not present

## 2016-10-24 DIAGNOSIS — B368 Other specified superficial mycoses: Secondary | ICD-10-CM | POA: Diagnosis not present

## 2016-10-24 DIAGNOSIS — N5089 Other specified disorders of the male genital organs: Secondary | ICD-10-CM | POA: Diagnosis not present

## 2016-10-24 DIAGNOSIS — Z7289 Other problems related to lifestyle: Secondary | ICD-10-CM | POA: Diagnosis not present

## 2016-12-02 ENCOUNTER — Other Ambulatory Visit: Payer: Self-pay | Admitting: Cardiovascular Disease

## 2016-12-28 DIAGNOSIS — L821 Other seborrheic keratosis: Secondary | ICD-10-CM | POA: Diagnosis not present

## 2016-12-28 DIAGNOSIS — L570 Actinic keratosis: Secondary | ICD-10-CM | POA: Diagnosis not present

## 2016-12-28 DIAGNOSIS — D225 Melanocytic nevi of trunk: Secondary | ICD-10-CM | POA: Diagnosis not present

## 2016-12-28 DIAGNOSIS — D2272 Melanocytic nevi of left lower limb, including hip: Secondary | ICD-10-CM | POA: Diagnosis not present

## 2016-12-28 DIAGNOSIS — D692 Other nonthrombocytopenic purpura: Secondary | ICD-10-CM | POA: Diagnosis not present

## 2016-12-31 DIAGNOSIS — I10 Essential (primary) hypertension: Secondary | ICD-10-CM | POA: Diagnosis not present

## 2016-12-31 DIAGNOSIS — E784 Other hyperlipidemia: Secondary | ICD-10-CM | POA: Diagnosis not present

## 2016-12-31 DIAGNOSIS — K5790 Diverticulosis of intestine, part unspecified, without perforation or abscess without bleeding: Secondary | ICD-10-CM | POA: Diagnosis not present

## 2016-12-31 DIAGNOSIS — M5416 Radiculopathy, lumbar region: Secondary | ICD-10-CM | POA: Diagnosis not present

## 2016-12-31 DIAGNOSIS — Z23 Encounter for immunization: Secondary | ICD-10-CM | POA: Diagnosis not present

## 2017-01-01 ENCOUNTER — Other Ambulatory Visit: Payer: Self-pay | Admitting: Cardiovascular Disease

## 2017-01-18 ENCOUNTER — Other Ambulatory Visit: Payer: Self-pay | Admitting: Cardiovascular Disease

## 2017-01-23 ENCOUNTER — Telehealth: Payer: Self-pay | Admitting: Cardiovascular Disease

## 2017-01-23 MED ORDER — METOPROLOL SUCCINATE ER 25 MG PO TB24: 25.0000 mg | ORAL_TABLET | Freq: Every day | ORAL | 0 refills | Status: DC

## 2017-01-23 NOTE — Telephone Encounter (Signed)
Patient received recall letter to schedule appointment with Dr. Clifton James, but none are available until January.  Confirmed with patient he is asymptomatic. He states he is fine to wait until January for appointment, but he needs Toprol refilled until then. Refill called in. He was grateful for call and will call to arrange OV next week when schedule is available.

## 2017-01-23 NOTE — Telephone Encounter (Signed)
New message    Pt is calling asking for a call back. He wants to know if he prescription can be extended until he can get an appt with Dr. Clifton James. Offered an appt with APP but pt does not want to see APP.

## 2017-04-18 DIAGNOSIS — M5431 Sciatica, right side: Secondary | ICD-10-CM | POA: Diagnosis not present

## 2017-04-18 DIAGNOSIS — M79604 Pain in right leg: Secondary | ICD-10-CM | POA: Diagnosis not present

## 2017-04-18 DIAGNOSIS — M545 Low back pain: Secondary | ICD-10-CM | POA: Diagnosis not present

## 2017-04-22 DIAGNOSIS — M545 Low back pain: Secondary | ICD-10-CM | POA: Diagnosis not present

## 2017-04-22 DIAGNOSIS — M5431 Sciatica, right side: Secondary | ICD-10-CM | POA: Diagnosis not present

## 2017-04-22 DIAGNOSIS — M48062 Spinal stenosis, lumbar region with neurogenic claudication: Secondary | ICD-10-CM | POA: Diagnosis not present

## 2017-04-22 DIAGNOSIS — M5416 Radiculopathy, lumbar region: Secondary | ICD-10-CM | POA: Diagnosis not present

## 2017-04-22 DIAGNOSIS — M79604 Pain in right leg: Secondary | ICD-10-CM | POA: Diagnosis not present

## 2017-04-22 DIAGNOSIS — M5136 Other intervertebral disc degeneration, lumbar region: Secondary | ICD-10-CM | POA: Diagnosis not present

## 2017-04-25 DIAGNOSIS — M545 Low back pain: Secondary | ICD-10-CM | POA: Diagnosis not present

## 2017-04-25 DIAGNOSIS — M79604 Pain in right leg: Secondary | ICD-10-CM | POA: Diagnosis not present

## 2017-04-25 DIAGNOSIS — M5431 Sciatica, right side: Secondary | ICD-10-CM | POA: Diagnosis not present

## 2017-04-29 DIAGNOSIS — M5416 Radiculopathy, lumbar region: Secondary | ICD-10-CM | POA: Diagnosis not present

## 2017-04-30 DIAGNOSIS — M5431 Sciatica, right side: Secondary | ICD-10-CM | POA: Diagnosis not present

## 2017-04-30 DIAGNOSIS — M79604 Pain in right leg: Secondary | ICD-10-CM | POA: Diagnosis not present

## 2017-04-30 DIAGNOSIS — M545 Low back pain: Secondary | ICD-10-CM | POA: Diagnosis not present

## 2017-05-01 DIAGNOSIS — M545 Low back pain: Secondary | ICD-10-CM | POA: Diagnosis not present

## 2017-05-03 DIAGNOSIS — M5126 Other intervertebral disc displacement, lumbar region: Secondary | ICD-10-CM | POA: Diagnosis not present

## 2017-05-03 DIAGNOSIS — I10 Essential (primary) hypertension: Secondary | ICD-10-CM | POA: Diagnosis not present

## 2017-05-03 DIAGNOSIS — Z6826 Body mass index (BMI) 26.0-26.9, adult: Secondary | ICD-10-CM | POA: Diagnosis not present

## 2017-05-06 ENCOUNTER — Other Ambulatory Visit: Payer: Self-pay | Admitting: Neurological Surgery

## 2017-05-06 DIAGNOSIS — M5126 Other intervertebral disc displacement, lumbar region: Secondary | ICD-10-CM

## 2017-05-09 ENCOUNTER — Other Ambulatory Visit: Payer: Self-pay | Admitting: Cardiovascular Disease

## 2017-05-09 ENCOUNTER — Ambulatory Visit
Admission: RE | Admit: 2017-05-09 | Discharge: 2017-05-09 | Disposition: A | Discharge status: Home / Self Care | Payer: BLUE CROSS/BLUE SHIELD | Source: Ambulatory Visit | Attending: Neurological Surgery | Admitting: Neurological Surgery

## 2017-05-09 DIAGNOSIS — M5117 Intervertebral disc disorders with radiculopathy, lumbosacral region: Secondary | ICD-10-CM | POA: Diagnosis not present

## 2017-05-09 DIAGNOSIS — M5126 Other intervertebral disc displacement, lumbar region: Secondary | ICD-10-CM

## 2017-05-09 MED ORDER — IOPAMIDOL (ISOVUE-M 200) INJECTION 41%
1.0000 mL | Freq: Once | INTRAMUSCULAR | Status: AC
  Administered 2017-05-09: 1 mL via EPIDURAL

## 2017-05-09 MED ORDER — METOPROLOL SUCCINATE ER 25 MG PO TB24: 25.0000 mg | ORAL_TABLET | Freq: Every day | ORAL | 0 refills | Status: DC

## 2017-05-09 MED ORDER — METHYLPREDNISOLONE ACETATE 40 MG/ML INJ SUSP (RADIOLOG
120.0000 mg | Freq: Once | INTRAMUSCULAR | Status: AC
  Administered 2017-05-09: 120 mg via EPIDURAL

## 2017-05-09 NOTE — Discharge Instructions (Signed)

## 2017-05-20 ENCOUNTER — Encounter: Payer: Self-pay | Admitting: Cardiovascular Disease

## 2017-05-20 ENCOUNTER — Ambulatory Visit: Payer: BLUE CROSS/BLUE SHIELD | Admitting: Cardiovascular Disease

## 2017-05-20 VITALS — BP 122/86 | HR 66 | Ht 71.0 in | Wt 190.2 lb

## 2017-05-20 DIAGNOSIS — I471 Supraventricular tachycardia: Secondary | ICD-10-CM | POA: Diagnosis not present

## 2017-05-20 MED ORDER — METOPROLOL SUCCINATE ER 25 MG PO TB24: 25.0000 mg | ORAL_TABLET | Freq: Every day | ORAL | 3 refills | Status: DC

## 2017-05-20 NOTE — Patient Instructions (Signed)

## 2017-05-20 NOTE — Progress Notes (Signed)
Chief Complaint  Patient presents with  . Follow-up    SVT     History of Present Illness: 47 yo male with history of SVT, HTN, HLD, vertigo here today for cardiac follow up. I met him in 2012 when he had c/o dizziness after exercising. Exercise stress test October 2012 with no ischemia,. Echo 2012 with normal LV systolic function, no valvular disease. He was seen in our office July 2017 by Almyra Deforest PA-C with c/o chest pain that was felt to be atypical and palpitations. Exercise stress test 11/14/15 with no ischemia. 30 day event monitor July 2017 with sinus, rare PVCs and one run of SVT. Toprol was started. Coronary CTA September 2017 with zero calcium score and no evidence of CAD.   He is here today for follow up. The patient denies any chest pain, dyspnea, palpitations, lower extremity edema, orthopnea, PND, dizziness, near syncope or syncope. He has been having back pain due to a disc issue. Also some ankle issues. Rare chest pains. No palpitations on Toprol.   Primary Care Physician: Burnard Bunting, MD  Past Medical History:  Diagnosis Date  . Allergic rhinitis   . Diverticulosis of colon (without mention of hemorrhage) 06/01/2013   CT Scan   . GERD (gastroesophageal reflux disease)   . HLD (hyperlipidemia)   . HTN (hypertension)   . SVT (supraventricular tachycardia) ([place])   . Vertigo     Past Surgical History:  Procedure Laterality Date  . NASAL SEPTOPLASTY W/ TURBINOPLASTY    . VASECTOMY      Current Outpatient Medications  Medication Sig Dispense Refill  . aspirin EC 81 MG tablet Take 81 mg by mouth daily.    Marland Kitchen atorvastatin (LIPITOR) 20 MG tablet Take 20 mg by mouth daily.      Marland Kitchen loratadine (CLARITIN) 10 MG tablet Take 10 mg by mouth daily.    Marland Kitchen losartan (COZAAR) 50 MG tablet Take 25 mg by mouth daily.      . metoprolol succinate (TOPROL-XL) 25 MG 24 hr tablet Take 1 tablet (25 mg total) by mouth daily. 90 tablet 3  . Multiple Vitamin (MULTIVITAMIN WITH MINERALS)  TABS tablet Take 1 tablet by mouth daily.    . pantoprazole (PROTONIX) 40 MG tablet Take 40 mg by mouth daily.       No current facility-administered medications for this visit.     Allergies  Allergen Reactions  . Sulfa Antibiotics Other (See Comments)    Rash and hives.    Social History   Socioeconomic History  . Marital status: Married    Spouse name: Not on file  . Number of children: 2  . Years of education: Not on file  . Highest education level: Not on file  Social Needs  . Financial resource strain: Not on file  . Food insecurity - worry: Not on file  . Food insecurity - inability: Not on file  . Transportation needs - medical: Not on file  . Transportation needs - non-medical: Not on file  Occupational History  . Occupation: Nurse, learning disability  Tobacco Use  . Smoking status: Never Smoker  . Smokeless tobacco: Never Used  Substance and Sexual Activity  . Alcohol use: Yes    Comment: Socially   . Drug use: No  . Sexual activity: Not on file  Other Topics Concern  . Not on file  Social History Narrative   Patient gets regular exercise     Family History  Problem Relation Age of  Onset  . Breast cancer Mother   . Heart disease Father   . Heart attack Father   . Heart attack Paternal Grandfather     Review of Systems:  As stated in the HPI and otherwise negative.   BP 122/86   Pulse 66   Ht 5' 11" (1.803 m)   Wt 190 lb 3.2 oz (86.3 kg)   SpO2 99%   BMI 26.53 kg/m   Physical Examination:  General: Well developed, well nourished, NAD  HEENT: OP clear, mucus membranes moist  SKIN: warm, dry. No rashes. Neuro: No focal deficits  Musculoskeletal: Muscle strength 5/5 all ext  Psychiatric: Mood and affect normal  Neck: No JVD, no carotid bruits, no thyromegaly, no lymphadenopathy.  Lungs:Clear bilaterally, no wheezes, rhonci, crackles Cardiovascular: Regular rate and rhythm. No murmurs, gallops or rubs. Abdomen:Soft. Bowel sounds present.  Non-tender.  Extremities: No lower extremity edema. Pulses are 2 + in the bilateral DP/PT.  EKG:  EKG is ordered today. The ekg ordered today demonstrates NSR  Recent Labs: No results found for requested labs within last 8760 hours.     Wt Readings from Last 3 Encounters:  05/20/17 190 lb 3.2 oz (86.3 kg)  01/02/16 181 lb (82.1 kg)  10/19/15 184 lb 9.6 oz (83.7 kg)     Other studies Reviewed: Additional studies/ records that were reviewed today include: . Review of the above records demonstrates:    Assessment and Plan:   1. SVT: No palpitations. Continue beta blocker.   2. Chest pain:  His pain is thought to be non-cardiac. Coronary CTA Sept 2017 with no evidence of CAD.   3. HTN: BP is well controlled. No changes   Current medicines are reviewed at length with the patient today.  The patient does not have concerns regarding medicines.  The following changes have been made:  no change  Labs/ tests ordered today include:   Orders Placed This Encounter  Procedures  . EKG 12-Lead    Disposition:   FU with me in 12 months  Signed, Lauree Chandler, MD 05/20/2017 2:59 PM    [address], [place], [place]  [address] Phone: [phone]; Fax: [phone]

## 2017-06-10 DIAGNOSIS — M5126 Other intervertebral disc displacement, lumbar region: Secondary | ICD-10-CM | POA: Diagnosis not present

## 2017-06-10 DIAGNOSIS — Z6827 Body mass index (BMI) 27.0-27.9, adult: Secondary | ICD-10-CM | POA: Diagnosis not present

## 2017-07-01 DIAGNOSIS — Z0000 Encounter for general adult medical examination without abnormal findings: Secondary | ICD-10-CM | POA: Diagnosis not present

## 2017-07-01 DIAGNOSIS — Z125 Encounter for screening for malignant neoplasm of prostate: Secondary | ICD-10-CM | POA: Diagnosis not present

## 2017-07-01 DIAGNOSIS — R82998 Other abnormal findings in urine: Secondary | ICD-10-CM | POA: Diagnosis not present

## 2017-07-01 DIAGNOSIS — I10 Essential (primary) hypertension: Secondary | ICD-10-CM | POA: Diagnosis not present

## 2017-07-08 DIAGNOSIS — E7849 Other hyperlipidemia: Secondary | ICD-10-CM | POA: Diagnosis not present

## 2017-07-08 DIAGNOSIS — Z1389 Encounter for screening for other disorder: Secondary | ICD-10-CM | POA: Diagnosis not present

## 2017-07-08 DIAGNOSIS — M5416 Radiculopathy, lumbar region: Secondary | ICD-10-CM | POA: Diagnosis not present

## 2017-07-08 DIAGNOSIS — Z0000 Encounter for general adult medical examination without abnormal findings: Secondary | ICD-10-CM | POA: Diagnosis not present

## 2017-07-08 DIAGNOSIS — I10 Essential (primary) hypertension: Secondary | ICD-10-CM | POA: Diagnosis not present

## 2017-07-12 DIAGNOSIS — Z1212 Encounter for screening for malignant neoplasm of rectum: Secondary | ICD-10-CM | POA: Diagnosis not present

## 2017-07-15 DIAGNOSIS — H5213 Myopia, bilateral: Secondary | ICD-10-CM | POA: Diagnosis not present

## 2017-08-12 DIAGNOSIS — M5126 Other intervertebral disc displacement, lumbar region: Secondary | ICD-10-CM | POA: Diagnosis not present

## 2017-08-12 DIAGNOSIS — Z6827 Body mass index (BMI) 27.0-27.9, adult: Secondary | ICD-10-CM | POA: Diagnosis not present

## 2018-01-23 NOTE — Progress Notes (Signed)
Chief Complaint  Patient presents with  . Follow-up    SVT    History of Present Illness: 47 yo male with history of GERD, SVT, HTN, hyperlipidemia and vertigo here today for cardiac follow up. I met him in 2012 when he had c/o dizziness after exercising. Exercise stress test October 2012 with no ischemia,. Echo 2012 with normal LV systolic function, no valvular disease. He was seen in our office July 2017 by Almyra Deforest PA-C with c/o chest pain that was felt to be atypical and palpitations. Exercise stress test 11/14/15 with no ischemia. 30 day event monitor July 2017 with sinus, rare PVCs and one run of SVT. Toprol was started. Coronary CTA September 2017 with zero calcium score and no evidence of CAD.   He is here today for follow up. The patient denies any chest pain, dyspnea, lower extremity edema, orthopnea, PND, dizziness, near syncope or syncope. He has been having more frequent episodes of tachycardia. This happens several times per month. When his heart races he feels lightheaded and becomes anxious.    Primary Care Physician: Burnard Bunting, MD  Past Medical History:  Diagnosis Date  . Allergic rhinitis   . Diverticulosis of colon (without mention of hemorrhage) 06/01/2013   CT Scan   . GERD (gastroesophageal reflux disease)   . HLD (hyperlipidemia)   . HTN (hypertension)   . SVT (supraventricular tachycardia) ([place])   . Vertigo     Past Surgical History:  Procedure Laterality Date  . NASAL SEPTOPLASTY W/ TURBINOPLASTY    . VASECTOMY      Current Outpatient Medications  Medication Sig Dispense Refill  . aspirin EC 81 MG tablet Take 81 mg by mouth daily.    Marland Kitchen atorvastatin (LIPITOR) 20 MG tablet Take 20 mg by mouth daily.      Marland Kitchen loratadine (CLARITIN) 10 MG tablet Take 10 mg by mouth daily.    Marland Kitchen losartan (COZAAR) 50 MG tablet Take 25 mg by mouth daily.      . Multiple Vitamin (MULTIVITAMIN WITH MINERALS) TABS tablet Take 1 tablet by mouth daily.    . pantoprazole  (PROTONIX) 40 MG tablet Take 40 mg by mouth daily.      . metoprolol succinate (TOPROL-XL) 50 MG 24 hr tablet Take 1 tablet (50 mg total) by mouth daily. Take with or immediately following a meal. 90 tablet 3   No current facility-administered medications for this visit.     Allergies  Allergen Reactions  . Sulfa Antibiotics Other (See Comments)    Rash and hives.    Social History   Socioeconomic History  . Marital status: Married    Spouse name: Not on file  . Number of children: 2  . Years of education: Not on file  . Highest education level: Not on file  Occupational History  . Occupation: Nurse, learning disability  Social Needs  . Financial resource strain: Not on file  . Food insecurity:    Worry: Not on file    Inability: Not on file  . Transportation needs:    Medical: Not on file    Non-medical: Not on file  Tobacco Use  . Smoking status: Never Smoker  . Smokeless tobacco: Never Used  Substance and Sexual Activity  . Alcohol use: Yes    Comment: Socially   . Drug use: No  . Sexual activity: Not on file  Lifestyle  . Physical activity:    Days per week: Not on file    Minutes  per session: Not on file  . Stress: Not on file  Relationships  . Social connections:    Talks on phone: Not on file    Gets together: Not on file    Attends religious service: Not on file    Active member of club or organization: Not on file    Attends meetings of clubs or organizations: Not on file    Relationship status: Not on file  . Intimate partner violence:    Fear of current or ex partner: Not on file    Emotionally abused: Not on file    Physically abused: Not on file    Forced sexual activity: Not on file  Other Topics Concern  . Not on file  Social History Narrative   Patient gets regular exercise     Family History  Problem Relation Age of Onset  . Breast cancer Mother   . Heart disease Father   . Heart attack Father   . Heart attack Paternal Grandfather      Review of Systems:  As stated in the HPI and otherwise negative.   BP 134/86   Pulse 74   Ht 5' 11"  (1.803 m)   Wt 190 lb 12.8 oz (86.5 kg)   BMI 26.61 kg/m   Physical Examination:  General: Well developed, well nourished, NAD  HEENT: OP clear, mucus membranes moist  SKIN: warm, dry. No rashes. Neuro: No focal deficits  Musculoskeletal: Muscle strength 5/5 all ext  Psychiatric: Mood and affect normal  Neck: No JVD, no carotid bruits, no thyromegaly, no lymphadenopathy.  Lungs:Clear bilaterally, no wheezes, rhonci, crackles Cardiovascular: Regular rate and rhythm. No murmurs, gallops or rubs. Abdomen:Soft. Bowel sounds present. Non-tender.  Extremities: No lower extremity edema. Pulses are 2 + in the bilateral DP/PT.  EKG:  EKG is ordered today. The ekg ordered today demonstrates NSR, rate 74 bpm.   Recent Labs: No results found for requested labs within last 8760 hours.     Wt Readings from Last 3 Encounters:  01/24/18 190 lb 12.8 oz (86.5 kg)  05/20/17 190 lb 3.2 oz (86.3 kg)  01/02/16 181 lb (82.1 kg)     Other studies Reviewed: Additional studies/ records that were reviewed today include: . Review of the above records demonstrates:    Assessment and Plan:   1. SVT: He is having more frequent runs of tachycardia, occurring now 2-3 times per month. He feels poorly when his heart is racing. I will increase his Toprol to 50 mg po daily.  I will arrange a 30 day event monitor to assess for SVT.   2. Chest pain:  No recurrence of chest pain over the past two years. Coronary CTA Sept 2017 with no evidence of CAD. No further ischemic workup at this time.   3. HTN: BP is controlled.    Current medicines are reviewed at length with the patient today.  The patient does not have concerns regarding medicines.  The following changes have been made:  no change  Labs/ tests ordered today include:   Orders Placed This Encounter  Procedures  . CARDIAC EVENT MONITOR   . EKG 12-Lead    Disposition:   FU with me in 2 months  Signed, Lauree Chandler, MD 01/24/2018 4:50 PM    [facility] [address], [place], [place]  [address] Phone: [phone]; Fax: [phone]

## 2018-01-24 ENCOUNTER — Encounter: Payer: Self-pay | Admitting: Cardiovascular Disease

## 2018-01-24 ENCOUNTER — Ambulatory Visit: Payer: 59 | Admitting: Cardiovascular Disease

## 2018-01-24 VITALS — BP 134/86 | HR 74 | Ht 71.0 in | Wt 190.8 lb

## 2018-01-24 DIAGNOSIS — I471 Supraventricular tachycardia: Secondary | ICD-10-CM

## 2018-01-24 DIAGNOSIS — I10 Essential (primary) hypertension: Secondary | ICD-10-CM | POA: Diagnosis not present

## 2018-01-24 MED ORDER — METOPROLOL SUCCINATE ER 50 MG PO TB24: 50.0000 mg | ORAL_TABLET | Freq: Every day | ORAL | 3 refills | Status: DC

## 2018-01-24 NOTE — Patient Instructions (Addendum)
Medication Instructions:  Your physician has recommended you make the following change in your medication: Increase Toprol to 50 mg by mouth daily.   If you need a refill on your cardiac medications before your next appointment, please call your pharmacy.   Lab work: none If you have labs (blood work) drawn today and your tests are completely normal, you will receive your results only by: Marland Kitchen MyChart Message (if you have MyChart) OR . A paper copy in the mail If you have any lab test that is abnormal or we need to change your treatment, we will call you to review the results.  Testing/Procedures: Your physician has recommended that you wear an event monitor. Event monitors are medical devices that record the heart's electrical activity. Doctors most often [place] these monitors to diagnose arrhythmias. Arrhythmias are problems with the speed or rhythm of the heartbeat. The monitor is a small, portable device. You can wear one while you do your normal daily activities. This is usually used to diagnose what is causing palpitations/syncope (passing out).    Follow-Up: You are scheduled for a follow up appointment on December 2,2019 at 9:40

## 2018-02-05 ENCOUNTER — Ambulatory Visit (INDEPENDENT_AMBULATORY_CARE_PROVIDER_SITE_OTHER): Payer: 59

## 2018-02-05 DIAGNOSIS — I471 Supraventricular tachycardia: Secondary | ICD-10-CM | POA: Diagnosis not present

## 2018-02-21 ENCOUNTER — Telehealth: Payer: Self-pay

## 2018-02-21 NOTE — Telephone Encounter (Signed)
Received Serious Monitor Report - Showed patient having Sinus Tachycardia with artifact on 02/20/18 5:26 pm. On report patient symptoms are listed as chest pain or pressure, shortness of breath, and light-headedness.  Tried to call patient, left message for patient to call back.  Had Dr. Clifton James review report. He advised for his nurse to call patient and see how he is doing later. Patient has a history of SVT, this is not a new finding will continue to monitor.

## 2018-02-21 NOTE — Telephone Encounter (Signed)
I spoke with pt regarding monitor report. I told pt Dr. Clifton James said we could refer him to EP if he was having symptoms with SVT.  Pt is feeling fine now and states he is trying to keep track of all episodes by activating monitor. He would like to finish out monitor course and follow up with Dr. Clifton James as planned on 03/17/18 to review results.

## 2018-02-21 NOTE — Telephone Encounter (Signed)
Patient returned call

## 2018-02-24 ENCOUNTER — Encounter: Payer: Self-pay | Admitting: Cardiovascular Disease

## 2018-03-06 ENCOUNTER — Encounter: Payer: Self-pay | Admitting: Neurology

## 2018-03-06 ENCOUNTER — Ambulatory Visit: Payer: 59 | Admitting: Psychology

## 2018-03-06 ENCOUNTER — Institutional Professional Consult (permissible substitution): Payer: Self-pay | Admitting: Neurology

## 2018-03-06 DIAGNOSIS — F411 Generalized anxiety disorder: Secondary | ICD-10-CM

## 2018-03-10 ENCOUNTER — Institutional Professional Consult (permissible substitution): Payer: Self-pay | Admitting: Neurology

## 2018-03-17 ENCOUNTER — Ambulatory Visit: Payer: 59 | Admitting: Cardiovascular Disease

## 2018-03-17 ENCOUNTER — Encounter: Payer: Self-pay | Admitting: Cardiovascular Disease

## 2018-03-17 VITALS — BP 124/80 | HR 85 | Ht 71.0 in | Wt 192.0 lb

## 2018-03-17 DIAGNOSIS — I10 Essential (primary) hypertension: Secondary | ICD-10-CM

## 2018-03-17 DIAGNOSIS — I471 Supraventricular tachycardia: Secondary | ICD-10-CM | POA: Diagnosis not present

## 2018-03-17 NOTE — Progress Notes (Signed)
Chief Complaint  Patient presents with  . Follow-up    HTN    History of Present Illness: 47 yo male with history of GERD, SVT, HTN, hyperlipidemia and vertigo here today for cardiac follow up. I met him in 2012 when he had c/o dizziness after exercising. Exercise stress test October 2012 with no ischemia,. Echo 2012 with normal LV systolic function, no valvular disease. He was seen in our office July 2017 by Almyra Deforest PA-C with c/o chest pain that was felt to be atypical and palpitations. Exercise stress test 11/14/15 with no ischemia. 30 day event monitor July 2017 with sinus, rare PVCs and one run of SVT. Toprol was started. Coronary CTA September 2017 with zero calcium score and no evidence of CAD. I saw him in the office 01/24/18 and he c/o more frequent episodes of tachycardia several times per month with associated dizziness. Cardiac event monitor with PACs and PVCs but no SVT.   He is here today for follow up. The patient denies any chest pain, dyspnea, lower extremity edema, orthopnea, PND, dizziness, near syncope or syncope. He has rare palpitations, usually only 1-2 times per month. Each episode lasts for a few seconds.     Primary Care Physician: Burnard Bunting, MD  Past Medical History:  Diagnosis Date  . Allergic rhinitis   . Diverticulosis of colon (without mention of hemorrhage) 06/01/2013   CT Scan   . GERD (gastroesophageal reflux disease)   . HLD (hyperlipidemia)   . HTN (hypertension)   . SVT (supraventricular tachycardia) ([place])   . Vertigo     Past Surgical History:  Procedure Laterality Date  . NASAL SEPTOPLASTY W/ TURBINOPLASTY    . VASECTOMY      Current Outpatient Medications  Medication Sig Dispense Refill  . aspirin EC 81 MG tablet Take 81 mg by mouth daily.    Marland Kitchen atorvastatin (LIPITOR) 20 MG tablet Take 20 mg by mouth daily.      Marland Kitchen loratadine (CLARITIN) 10 MG tablet Take 10 mg by mouth daily.    Marland Kitchen losartan (COZAAR) 50 MG tablet Take 50 mg by mouth  daily.     . metoprolol succinate (TOPROL-XL) 50 MG 24 hr tablet Take 1 tablet (50 mg total) by mouth daily. Take with or immediately following a meal. 90 tablet 3  . Multiple Vitamin (MULTIVITAMIN WITH MINERALS) TABS tablet Take 1 tablet by mouth daily.    . pantoprazole (PROTONIX) 40 MG tablet Take 40 mg by mouth daily.       No current facility-administered medications for this visit.     Allergies  Allergen Reactions  . Sulfa Antibiotics Other (See Comments)    Rash and hives.    Social History   Socioeconomic History  . Marital status: Married    Spouse name: Not on file  . Number of children: 2  . Years of education: Not on file  . Highest education level: Not on file  Occupational History  . Occupation: Nurse, learning disability  Social Needs  . Financial resource strain: Not on file  . Food insecurity:    Worry: Not on file    Inability: Not on file  . Transportation needs:    Medical: Not on file    Non-medical: Not on file  Tobacco Use  . Smoking status: Never Smoker  . Smokeless tobacco: Never Used  Substance and Sexual Activity  . Alcohol use: Yes    Alcohol/week: 2.0 - 3.0 standard drinks    Types:  2 - 3 Standard drinks or equivalent per week    Comment: Socially   . Drug use: No  . Sexual activity: Not on file  Lifestyle  . Physical activity:    Days per week: Not on file    Minutes per session: Not on file  . Stress: Not on file  Relationships  . Social connections:    Talks on phone: Not on file    Gets together: Not on file    Attends religious service: Not on file    Active member of club or organization: Not on file    Attends meetings of clubs or organizations: Not on file    Relationship status: Not on file  . Intimate partner violence:    Fear of current or ex partner: Not on file    Emotionally abused: Not on file    Physically abused: Not on file    Forced sexual activity: Not on file  Other Topics Concern  . Not on file  Social History  Narrative   Patient gets regular exercise     Family History  Problem Relation Age of Onset  . Breast cancer Mother   . Heart disease Father   . Heart attack Father   . Heart attack Paternal Grandfather     Review of Systems:  As stated in the HPI and otherwise negative.   BP 124/80   Pulse 85   Ht 5' 11"  (1.803 m)   Wt 192 lb (87.1 kg)   SpO2 96%   BMI 26.78 kg/m   Physical Examination:  General: Well developed, well nourished, NAD  HEENT: OP clear, mucus membranes moist  SKIN: warm, dry. No rashes. Neuro: No focal deficits  Musculoskeletal: Muscle strength 5/5 all ext  Psychiatric: Mood and affect normal  Neck: No JVD, no carotid bruits, no thyromegaly, no lymphadenopathy.  Lungs:Clear bilaterally, no wheezes, rhonci, crackles Cardiovascular: Regular rate and rhythm. No murmurs, gallops or rubs. Abdomen:Soft. Bowel sounds present. Non-tender.  Extremities: No lower extremity edema. Pulses are 2 + in the bilateral DP/PT.  EKG:  EKG is not ordered today. The ekg ordered today demonstrates    Recent Labs: No results found for requested labs within last 8760 hours.     Wt Readings from Last 3 Encounters:  03/17/18 192 lb (87.1 kg)  01/24/18 190 lb 12.8 oz (86.5 kg)  05/20/17 190 lb 3.2 oz (86.3 kg)     Other studies Reviewed: Additional studies/ records that were reviewed today include: . Review of the above records demonstrates:    Assessment and Plan:   1. PACs/PVCs/History of SVT: Recent cardiac monitor November 2019 with PVCs and PACs with one episode of possible SVT.  Continue Toprol.    2. HTN: BP is controlled. NO changes today.   Current medicines are reviewed at length with the patient today.  The patient does not have concerns regarding medicines.  The following changes have been made:  no change  Labs/ tests ordered today include:   No orders of the defined types were placed in this encounter.   Disposition:   FU with me in 12  months  Signed, Lauree Chandler, MD 03/17/2018 10:17 AM    [facility] [address], [place], [place]  [address] Phone: [phone]; Fax: [phone]

## 2018-03-17 NOTE — Patient Instructions (Signed)

## 2018-04-02 ENCOUNTER — Ambulatory Visit: Payer: 59 | Admitting: Psychology

## 2018-04-02 DIAGNOSIS — F411 Generalized anxiety disorder: Secondary | ICD-10-CM | POA: Diagnosis not present

## 2018-05-02 ENCOUNTER — Ambulatory Visit: Payer: 59 | Admitting: Psychology

## 2018-05-02 DIAGNOSIS — F411 Generalized anxiety disorder: Secondary | ICD-10-CM

## 2018-05-07 ENCOUNTER — Institutional Professional Consult (permissible substitution): Payer: Self-pay | Admitting: Neurology

## 2018-05-28 ENCOUNTER — Ambulatory Visit: Payer: 59 | Admitting: Psychology

## 2018-05-28 DIAGNOSIS — F411 Generalized anxiety disorder: Secondary | ICD-10-CM

## 2018-06-27 ENCOUNTER — Ambulatory Visit: Payer: 59 | Admitting: Psychology

## 2018-07-31 ENCOUNTER — Ambulatory Visit (INDEPENDENT_AMBULATORY_CARE_PROVIDER_SITE_OTHER): Payer: 59 | Admitting: Psychology

## 2018-07-31 DIAGNOSIS — F411 Generalized anxiety disorder: Secondary | ICD-10-CM | POA: Diagnosis not present

## 2018-08-18 ENCOUNTER — Ambulatory Visit (INDEPENDENT_AMBULATORY_CARE_PROVIDER_SITE_OTHER): Payer: 59 | Admitting: Psychology

## 2018-08-18 DIAGNOSIS — F411 Generalized anxiety disorder: Secondary | ICD-10-CM

## 2018-09-16 ENCOUNTER — Ambulatory Visit: Payer: 59 | Admitting: Psychology

## 2019-02-03 ENCOUNTER — Other Ambulatory Visit: Payer: Self-pay | Admitting: Cardiovascular Disease

## 2019-02-03 MED ORDER — METOPROLOL SUCCINATE ER 50 MG PO TB24: 50.0000 mg | ORAL_TABLET | Freq: Every day | ORAL | 0 refills | Status: DC

## 2019-04-01 ENCOUNTER — Other Ambulatory Visit: Payer: Self-pay

## 2019-04-15 ENCOUNTER — Ambulatory Visit: Payer: 59 | Attending: Internal Medicine

## 2019-04-15 DIAGNOSIS — Z20822 Contact with and (suspected) exposure to covid-19: Secondary | ICD-10-CM

## 2019-04-17 LAB — NOVEL CORONAVIRUS, NAA: SARS-CoV-2, NAA: NOT DETECTED

## 2019-05-05 ENCOUNTER — Other Ambulatory Visit: Payer: Self-pay

## 2019-05-05 MED ORDER — METOPROLOL SUCCINATE ER 50 MG PO TB24: 50.0000 mg | ORAL_TABLET | Freq: Every day | ORAL | 0 refills | Status: DC

## 2019-05-10 NOTE — Progress Notes (Deleted)
No chief complaint on file.   History of Present Illness: 49 yo male with history of GERD, SVT, HTN, hyperlipidemia and vertigo here today for cardiac follow up. I met him in 2012 when he had c/o dizziness after exercising. Exercise stress test October 2012 with no ischemia,. Echo 2012 with normal LV systolic function, no valvular disease. He was seen in our office July 2017 by Almyra Deforest PA-C with c/o chest pain that was felt to be atypical and palpitations. Exercise stress test 11/14/15 with no ischemia. 30 day event monitor July 2017 with sinus, rare PVCs and one run of SVT. Toprol was started. Coronary CTA September 2017 with zero calcium score and no evidence of CAD. I saw him in the office 01/24/18 and he c/o more frequent episodes of tachycardia several times per month with associated dizziness. Cardiac event monitor with PACs and PVCs but no SVT.   He is here today for follow up. The patient denies any chest pain, dyspnea, palpitations, lower extremity edema, orthopnea, PND, dizziness, near syncope or syncope.      Primary Care Physician: Burnard Bunting, MD  Past Medical History:  Diagnosis Date  . Allergic rhinitis   . Diverticulosis of colon (without mention of hemorrhage) 06/01/2013   CT Scan   . GERD (gastroesophageal reflux disease)   . HLD (hyperlipidemia)   . HTN (hypertension)   . SVT (supraventricular tachycardia) ([facility])   . Vertigo     Past Surgical History:  Procedure Laterality Date  . NASAL SEPTOPLASTY W/ TURBINOPLASTY    . VASECTOMY      Current Outpatient Medications  Medication Sig Dispense Refill  . aspirin EC 81 MG tablet Take 81 mg by mouth daily.    Marland Kitchen atorvastatin (LIPITOR) 20 MG tablet Take 20 mg by mouth daily.      Marland Kitchen loratadine (CLARITIN) 10 MG tablet Take 10 mg by mouth daily.    Marland Kitchen losartan (COZAAR) 50 MG tablet Take 50 mg by mouth daily.     . metoprolol succinate (TOPROL-XL) 50 MG 24 hr tablet Take 1 tablet (50 mg total) by mouth daily. Take with  or immediately following a meal. Please keep upcoming appt for refills. Thank you 30 tablet 0  . Multiple Vitamin (MULTIVITAMIN WITH MINERALS) TABS tablet Take 1 tablet by mouth daily.    . pantoprazole (PROTONIX) 40 MG tablet Take 40 mg by mouth daily.       No current facility-administered medications for this visit.    Allergies  Allergen Reactions  . Sulfa Antibiotics Other (See Comments)    Rash and hives.    Social History   Socioeconomic History  . Marital status: Married    Spouse name: Not on file  . Number of children: 2  . Years of education: Not on file  . Highest education level: Not on file  Occupational History  . Occupation: Nurse, learning disability  Tobacco Use  . Smoking status: Never Smoker  . Smokeless tobacco: Never Used  Substance and Sexual Activity  . Alcohol use: Yes    Alcohol/week: 2.0 - 3.0 standard drinks    Types: 2 - 3 Standard drinks or equivalent per week    Comment: Socially   . Drug use: No  . Sexual activity: Not on file  Other Topics Concern  . Not on file  Social History Narrative   Patient gets regular exercise    Social Determinants of Health   Financial Resource Strain:   . Difficulty of Paying  Living Expenses: Not on file  Food Insecurity:   . Worried About Charity fundraiser in the Last Year: Not on file  . Ran Out of Food in the Last Year: Not on file  Transportation Needs:   . Lack of Transportation (Medical): Not on file  . Lack of Transportation (Non-Medical): Not on file  Physical Activity:   . Days of Exercise per Week: Not on file  . Minutes of Exercise per Session: Not on file  Stress:   . Feeling of Stress : Not on file  Social Connections:   . Frequency of Communication with Friends and Family: Not on file  . Frequency of Social Gatherings with Friends and Family: Not on file  . Attends Religious Services: Not on file  . Active Member of Clubs or Organizations: Not on file  . Attends Archivist  Meetings: Not on file  . Marital Status: Not on file  Intimate Partner Violence:   . Fear of Current or Ex-Partner: Not on file  . Emotionally Abused: Not on file  . Physically Abused: Not on file  . Sexually Abused: Not on file    Family History  Problem Relation Age of Onset  . Breast cancer Mother   . Heart disease Father   . Heart attack Father   . Heart attack Paternal Grandfather     Review of Systems:  As stated in the HPI and otherwise negative.   There were no vitals taken for this visit.  Physical Examination:  General: Well developed, well nourished, NAD  HEENT: OP clear, mucus membranes moist  SKIN: warm, dry. No rashes. Neuro: No focal deficits  Musculoskeletal: Muscle strength 5/5 all ext  Psychiatric: Mood and affect normal  Neck: No JVD, no carotid bruits, no thyromegaly, no lymphadenopathy.  Lungs:Clear bilaterally, no wheezes, rhonci, crackles Cardiovascular: Regular rate and rhythm. No murmurs, gallops or rubs. Abdomen:Soft. Bowel sounds present. Non-tender.  Extremities: No lower extremity edema. Pulses are 2 + in the bilateral DP/PT.  EKG:  EKG is *** ordered today. The ekg ordered today demonstrates    Recent Labs: No results found for requested labs within last 8760 hours.     Wt Readings from Last 3 Encounters:  03/17/18 192 lb (87.1 kg)  01/24/18 190 lb 12.8 oz (86.5 kg)  05/20/17 190 lb 3.2 oz (86.3 kg)     Other studies Reviewed: Additional studies/ records that were reviewed today include: . Review of the above records demonstrates:    Assessment and Plan:   1. PACs/PVCs/History of SVT: Cardiac monitor November 2019 with PVCs and PACs with one episode of possible SVT.  He has rare palpitations. Will continue Toprol. .    2. HTN: BP is controlled. No changes today  Current medicines are reviewed at length with the patient today.  The patient does not have concerns regarding medicines.  The following changes have been made:  no  change  Labs/ tests ordered today include:   No orders of the defined types were placed in this encounter.   Disposition:   FU with me in 12 months  Signed, Lauree Chandler, MD 05/10/2019 5:39 PM    [address], [place], [place]  [address] Phone: [phone]; Fax: [phone]

## 2019-05-11 ENCOUNTER — Ambulatory Visit: Payer: 59 | Admitting: Cardiovascular Disease

## 2019-05-14 ENCOUNTER — Ambulatory Visit: Payer: 59 | Attending: Internal Medicine

## 2019-05-14 DIAGNOSIS — Z20822 Contact with and (suspected) exposure to covid-19: Secondary | ICD-10-CM

## 2019-05-15 LAB — NOVEL CORONAVIRUS, NAA: SARS-CoV-2, NAA: NOT DETECTED

## 2019-06-03 ENCOUNTER — Other Ambulatory Visit: Payer: Self-pay

## 2019-06-03 MED ORDER — METOPROLOL SUCCINATE ER 50 MG PO TB24: 50.0000 mg | ORAL_TABLET | Freq: Every day | ORAL | 0 refills | Status: DC

## 2019-06-24 ENCOUNTER — Ambulatory Visit: Payer: 59 | Attending: Internal Medicine

## 2019-06-24 DIAGNOSIS — Z20822 Contact with and (suspected) exposure to covid-19: Secondary | ICD-10-CM

## 2019-06-25 ENCOUNTER — Encounter: Payer: Self-pay | Admitting: Gastroenterology

## 2019-06-25 LAB — NOVEL CORONAVIRUS, NAA: SARS-CoV-2, NAA: NOT DETECTED

## 2019-07-07 ENCOUNTER — Encounter: Payer: Self-pay | Admitting: Gastroenterology

## 2019-07-07 ENCOUNTER — Ambulatory Visit: Payer: 59 | Admitting: Gastroenterology

## 2019-07-07 VITALS — BP 120/80 | HR 72 | Temp 97.8°F | Ht 70.75 in | Wt 188.1 lb

## 2019-07-07 DIAGNOSIS — K5792 Diverticulitis of intestine, part unspecified, without perforation or abscess without bleeding: Secondary | ICD-10-CM | POA: Diagnosis not present

## 2019-07-07 DIAGNOSIS — R1032 Left lower quadrant pain: Secondary | ICD-10-CM | POA: Diagnosis not present

## 2019-07-07 NOTE — Progress Notes (Signed)
Assessment and plan reviewed 

## 2019-07-07 NOTE — Patient Instructions (Addendum)
If you are age 51 or older, your body mass index should be between 23-30. Your Body mass index is 26.42 kg/m. If this is out of the aforementioned range listed, please consider follow up with your Primary Care Provider.  If you are age 46 or younger, your body mass index should be between 19-25. Your Body mass index is 26.42 kg/m. If this is out of the aformentioned range listed, please consider follow up with your Primary Care Provider.   Start Investment banker, operational probiotic daily.   Call back with any ongoing or recurrent issues ask to for [facility].

## 2019-07-07 NOTE — Progress Notes (Signed)
07/07/2019 Gerald Armstrong [id number] January 12, 1971   HISTORY OF PRESENT ILLNESS:  This is a 49 year old male who is a patient of Dr. Lamar Sprinkles.  Has history of diverticulitis in the past, no problems for years.  Then in January he developed LLQ abdominal pain and apparently had an elevated WBC count.  Was treated for diverticulitis with cipro and flagyl x 10 days (only took for 3 days of it, however, because ? Side effects/reaction).  So then he was placed on Augmentin x 7 days and symptoms went away.  Then again earlier this month (March) he developed tenderness in lower abdomen again, about 3 weeks ago.  Bloating as well.  Also described a burning pain in his central mid-abdomen as well.  Saw PCP again.  CT scan of the abdomen and pelvis with contrast 06/24/2019 showed mild stranding in the region of numerous diverticuli at the junction of the descending and sigmoid colon which may be acute or resolving diverticulitis.  No abscess or perforation.  WBC count normal.  Augmentin x 10 days and completed 3/19 (4 days ago).  Says that at this point he is feeling much improved, but does still have occasional mild discomfort in his lower abdomen.  He says that some days he feels just fine, but then other days he is still a little uncomfortable.  Last colonoscopy 06/2013 by Dr. Marina Goodell with only moderate left sided diverticulosis.  Referred here by PCP, Dr. Jacky Kindle, regarding recurrent diverticulitis and LLQ abdominal pain.  Past Medical History:  Diagnosis Date  . Allergic rhinitis   . Diverticulitis   . Diverticulosis of colon (without mention of hemorrhage) 06/01/2013   CT Scan   . GERD (gastroesophageal reflux disease)   . HLD (hyperlipidemia)   . HTN (hypertension)   . SVT (supraventricular tachycardia) ([facility])   . Vertigo    Past Surgical History:  Procedure Laterality Date  . NASAL SEPTOPLASTY W/ TURBINOPLASTY    . VASECTOMY      reports that he has never smoked. He has never used smokeless  tobacco. He reports current alcohol use of about 2.0 - 3.0 standard drinks of alcohol per week. He reports that he does not use drugs. family history includes Breast cancer in his mother; Heart attack in his father and paternal grandfather; Heart disease in his father; Uterine cancer in his mother. Allergies  Allergen Reactions  . Sulfa Antibiotics Other (See Comments)    Rash and hives.      Outpatient Encounter Medications as of 07/07/2019  Medication Sig  . aspirin EC 81 MG tablet Take 81 mg by mouth daily.  Marland Kitchen atorvastatin (LIPITOR) 20 MG tablet Take 20 mg by mouth daily.    . fluticasone (FLONASE) 50 MCG/ACT nasal spray Place 2 sprays into both nostrils as needed.   . loratadine (CLARITIN) 10 MG tablet Take 10 mg by mouth daily.  Marland Kitchen losartan (COZAAR) 50 MG tablet Take 50 mg by mouth daily.   . metoprolol succinate (TOPROL-XL) 50 MG 24 hr tablet Take 1 tablet (50 mg total) by mouth daily. Take with or immediately following a meal. Please keep upcoming appt for refills. Thank you  . Multiple Vitamin (MULTIVITAMIN WITH MINERALS) TABS tablet Take 1 tablet by mouth daily.  . niacin 250 MG tablet Take 1 tablet by mouth daily.  . pantoprazole (PROTONIX) 40 MG tablet Take 40 mg by mouth daily.     No facility-administered encounter medications on file as of 07/07/2019.  REVIEW OF SYSTEMS  : All other systems reviewed and negative except where noted in the History of Present Illness.   PHYSICAL EXAM: BP 120/80 (BP Location: Left Arm, Patient Position: Sitting, Cuff Size: Normal)   Pulse 72   Temp 97.8 F (36.6 C)   Ht 5' 10.75" (1.797 m) Comment: height measured without shoes  Wt 188 lb 2 oz (85.3 kg)   BMI 26.42 kg/m  General: Well developed white male in no acute distress Head: Normocephalic and atraumatic Eyes:  Sclerae anicteric, conjunctiva pink. Ears: Normal auditory acuity Lungs: Clear throughout to auscultation; no increased WOB. Heart: Regular rate and rhythm; no  M/R/G. Abdomen: Soft, non-distended.  BS present.  Minimal lower abdominal TTP. Musculoskeletal: Symmetrical with no gross deformities  Skin: No lesions on visible extremities Extremities: No edema  Neurological: Alert oriented x 4, grossly non-focal Psychological:  Alert and cooperative. Normal mood and affect  ASSESSMENT AND PLAN: *Left lower quadrant abdominal pain with CT scan consistent with uncomplicated diverticulitis at the descending/sigmoid colon region.  Had suspected episode in January that was treated, symptoms resolved.  Then recurrence earlier this month (confirmed by CT scan), just completed another course of antibiotics, Augmentin, 4 days ago.  Still with some mild lower abdominal discomfort.  Will give some more time to see if he continues to improve.  I have advised to start a daily probiotic in the form of Florastor or align.  He will call us back if he fails to have continued improvement in his symptoms or certainly if symptoms worsen again, may need longer course of antibiotics.   CC:  Burnard Bunting, MD

## 2019-07-14 ENCOUNTER — Telehealth: Payer: Self-pay | Admitting: Gastroenterology

## 2019-07-14 NOTE — Telephone Encounter (Signed)
Left message on machine to call back  

## 2019-07-14 NOTE — Telephone Encounter (Signed)
The pt saw Shanda Bumps about a week ago for possible diverticulitis.  He had just finished abx prior to the appt.  He was asked to call back if the pain did not resolve.  He says he is no better but not worse.  He has lower left side abd pain that feels like the last time he had diverticulitis.  No other symptoms.  He states he will be traveling tomorrow but is willing to take another course of abx if that is what Shanda Bumps recommends.  Please advise

## 2019-07-14 NOTE — Telephone Encounter (Signed)
Pt called to update on his condition.  He stated that his diverticulitis has not gotten better.

## 2019-07-14 NOTE — Telephone Encounter (Signed)
Lets do another course of Augmentin 875 mg twice daily for 14 days.  Have him call here for follow-up upon completion.

## 2019-07-15 MED ORDER — AMOXICILLIN-POT CLAVULANATE 875-125 MG PO TABS: 1.0000 | ORAL_TABLET | Freq: Two times a day (BID) | ORAL | 0 refills | Status: AC

## 2019-07-15 NOTE — Telephone Encounter (Signed)
Left message on machine to call back  

## 2019-07-16 NOTE — Telephone Encounter (Signed)
Left message on machine to call back  

## 2019-07-16 NOTE — Telephone Encounter (Signed)
Patient returned your call.

## 2019-07-20 NOTE — Telephone Encounter (Signed)
Left message on machine to call back  

## 2019-07-21 NOTE — Telephone Encounter (Signed)
Left message on machine to call back  

## 2019-07-21 NOTE — Telephone Encounter (Signed)
I have been unable to reach pt will await further communication from the pt

## 2019-07-27 ENCOUNTER — Ambulatory Visit: Payer: 59 | Admitting: Cardiovascular Disease

## 2019-07-30 NOTE — Progress Notes (Signed)
Chief Complaint  Patient presents with  . Follow-up    SVT     History of Present Illness: 49 yo male with history of GERD, SVT, HTN, hyperlipidemia and vertigo here today for cardiac follow up. I met him in 2012 when he had c/o dizziness after exercising. Exercise stress test October 2012 with no ischemia,. Echo 2012 with normal LV systolic function, no valvular disease. He was seen in our office July 2017 by Almyra Deforest PA-C with c/o chest pain that was felt to be atypical and palpitations. Exercise stress test 11/14/15 with no ischemia. 30 day event monitor July 2017 with sinus, rare PVCs and one run of SVT. Toprol was started. Coronary CTA September 2017 with zero calcium score and no evidence of CAD. I saw him in the office 01/24/18 and he c/o more frequent episodes of tachycardia several times per month with associated dizziness. Cardiac event monitor with PACs and PVCs but no SVT.   He is here today for follow up. The patient denies any chest pain, dyspnea, lower extremity edema, orthopnea, PND, dizziness, near syncope or syncope. He had tachycardia following his first Covid shot. This was sinus tach on his [employer].     Primary Care Physician: Burnard Bunting, MD  Past Medical History:  Diagnosis Date  . Allergic rhinitis   . Diverticulitis   . Diverticulosis of colon (without mention of hemorrhage) 06/01/2013   CT Scan   . GERD (gastroesophageal reflux disease)   . HLD (hyperlipidemia)   . HTN (hypertension)   . SVT (supraventricular tachycardia) ([place])   . Vertigo     Past Surgical History:  Procedure Laterality Date  . NASAL SEPTOPLASTY W/ TURBINOPLASTY    . VASECTOMY      Current Outpatient Medications  Medication Sig Dispense Refill  . aspirin EC 81 MG tablet Take 81 mg by mouth daily.    Marland Kitchen atorvastatin (LIPITOR) 20 MG tablet Take 20 mg by mouth daily.      . fluticasone (FLONASE) 50 MCG/ACT nasal spray Place 2 sprays into both nostrils as needed.     .  loratadine (CLARITIN) 10 MG tablet Take 10 mg by mouth daily.    Marland Kitchen losartan (COZAAR) 50 MG tablet Take 50 mg by mouth daily.     . metoprolol succinate (TOPROL-XL) 50 MG 24 hr tablet Take 1 tablet (50 mg total) by mouth daily. Take with or immediately following a meal. Please keep upcoming appt for refills. Thank you 90 tablet 0  . Multiple Vitamin (MULTIVITAMIN WITH MINERALS) TABS tablet Take 1 tablet by mouth daily.    . niacin 250 MG tablet Take 1 tablet by mouth daily.    . pantoprazole (PROTONIX) 40 MG tablet Take 40 mg by mouth daily.       No current facility-administered medications for this visit.    Allergies  Allergen Reactions  . Sulfa Antibiotics Other (See Comments)    Rash and hives.  . Metronidazole Other (See Comments)    Social History   Socioeconomic History  . Marital status: Married    Spouse name: Not on file  . Number of children: 2  . Years of education: Not on file  . Highest education level: Not on file  Occupational History  . Occupation: Nurse, learning disability  Tobacco Use  . Smoking status: Never Smoker  . Smokeless tobacco: Never Used  Substance and Sexual Activity  . Alcohol use: Yes    Alcohol/week: 2.0 - 3.0 standard drinks  Types: 2 - 3 Standard drinks or equivalent per week    Comment: Socially/ one per day  . Drug use: No  . Sexual activity: Not on file  Other Topics Concern  . Not on file  Social History Narrative   Patient gets regular exercise    Social Determinants of Health   Financial Resource Strain:   . Difficulty of Paying Living Expenses:   Food Insecurity:   . Worried About Charity fundraiser in the Last Year:   . Arboriculturist in the Last Year:   Transportation Needs:   . Film/video editor (Medical):   Marland Kitchen Lack of Transportation (Non-Medical):   Physical Activity:   . Days of Exercise per Week:   . Minutes of Exercise per Session:   Stress:   . Feeling of Stress :   Social Connections:   . Frequency of  Communication with Friends and Family:   . Frequency of Social Gatherings with Friends and Family:   . Attends Religious Services:   . Active Member of Clubs or Organizations:   . Attends Archivist Meetings:   Marland Kitchen Marital Status:   Intimate Partner Violence:   . Fear of Current or Ex-Partner:   . Emotionally Abused:   Marland Kitchen Physically Abused:   . Sexually Abused:     Family History  Problem Relation Age of Onset  . Breast cancer Mother   . Uterine cancer Mother   . Heart disease Father   . Heart attack Father   . Heart attack Paternal Grandfather     Review of Systems:  As stated in the HPI and otherwise negative.   BP 128/80   Pulse 67   Ht 5' 10.75" (1.797 m)   Wt 190 lb 6.4 oz (86.4 kg)   SpO2 99%   BMI 26.74 kg/m   Physical Examination:  General: Well developed, well nourished, NAD  HEENT: OP clear, mucus membranes moist  SKIN: warm, dry. No rashes. Neuro: No focal deficits  Musculoskeletal: Muscle strength 5/5 all ext  Psychiatric: Mood and affect normal  Neck: No JVD, no carotid bruits, no thyromegaly, no lymphadenopathy.  Lungs:Clear bilaterally, no wheezes, rhonci, crackles Cardiovascular: Regular rate and rhythm. No murmurs, gallops or rubs. Abdomen:Soft. Bowel sounds present. Non-tender.  Extremities: No lower extremity edema. Pulses are 2 + in the bilateral DP/PT.    EKG:  EKG is ordered today. The ekg ordered today demonstrates  NSR, rate 67 bpm  Recent Labs: No results found for requested labs within last 8760 hours.     Wt Readings from Last 3 Encounters:  07/31/19 190 lb 6.4 oz (86.4 kg)  07/07/19 188 lb 2 oz (85.3 kg)  03/17/18 192 lb (87.1 kg)     Other studies Reviewed: Additional studies/ records that were reviewed today include: . Review of the above records demonstrates:    Assessment and Plan:   1. PACs/PVCs/History of SVT: Cardiac monitor November 2019 with PVCs and PACs with one episode of possible SVT. No recent  palpitations. Continue Toprol.     2. HTN: BP controlled. Continue current therapy.   Current medicines are reviewed at length with the patient today.  The patient does not have concerns regarding medicines.  The following changes have been made:  no change  Labs/ tests ordered today include:   Orders Placed This Encounter  Procedures  . EKG 12-Lead    Disposition:   FU with me in 12 months  Signed, Lauree Chandler,  MD 07/31/2019 10:59 AM    [facility] [address], [place], [place]  [address] Phone: [phone]; Fax: [phone]

## 2019-07-31 ENCOUNTER — Encounter: Payer: Self-pay | Admitting: Cardiovascular Disease

## 2019-07-31 ENCOUNTER — Ambulatory Visit: Payer: 59 | Admitting: Cardiovascular Disease

## 2019-07-31 ENCOUNTER — Other Ambulatory Visit: Payer: Self-pay

## 2019-07-31 VITALS — BP 128/80 | HR 67 | Ht 70.75 in | Wt 190.4 lb

## 2019-07-31 DIAGNOSIS — I471 Supraventricular tachycardia: Secondary | ICD-10-CM

## 2019-07-31 DIAGNOSIS — I10 Essential (primary) hypertension: Secondary | ICD-10-CM | POA: Diagnosis not present

## 2019-07-31 NOTE — Patient Instructions (Signed)

## 2019-08-31 ENCOUNTER — Other Ambulatory Visit: Payer: Self-pay

## 2019-08-31 MED ORDER — METOPROLOL SUCCINATE ER 50 MG PO TB24: 50.0000 mg | ORAL_TABLET | Freq: Every day | ORAL | 3 refills | Status: DC

## 2020-01-21 ENCOUNTER — Other Ambulatory Visit: Payer: 59

## 2020-01-21 DIAGNOSIS — Z20822 Contact with and (suspected) exposure to covid-19: Secondary | ICD-10-CM

## 2020-01-22 LAB — NOVEL CORONAVIRUS, NAA: SARS-CoV-2, NAA: NOT DETECTED

## 2020-01-22 LAB — SARS-COV-2, NAA 2 DAY TAT

## 2020-09-13 ENCOUNTER — Other Ambulatory Visit: Payer: Self-pay

## 2020-09-13 MED ORDER — METOPROLOL SUCCINATE ER 50 MG PO TB24: 50.0000 mg | ORAL_TABLET | Freq: Every day | ORAL | 1 refills | Status: DC

## 2020-10-13 ENCOUNTER — Ambulatory Visit: Payer: No Typology Code available for payment source | Admitting: Cardiovascular Disease

## 2020-10-13 ENCOUNTER — Encounter: Payer: Self-pay | Admitting: Cardiovascular Disease

## 2020-10-13 ENCOUNTER — Other Ambulatory Visit: Payer: Self-pay

## 2020-10-13 VITALS — BP 124/78 | HR 67 | Ht 70.75 in | Wt 190.4 lb

## 2020-10-13 DIAGNOSIS — I471 Supraventricular tachycardia: Secondary | ICD-10-CM

## 2020-10-13 DIAGNOSIS — I10 Essential (primary) hypertension: Secondary | ICD-10-CM | POA: Diagnosis not present

## 2020-10-13 MED ORDER — METOPROLOL SUCCINATE ER 50 MG PO TB24: 50.0000 mg | ORAL_TABLET | Freq: Every day | ORAL | 3 refills | Status: DC

## 2020-10-13 NOTE — Progress Notes (Signed)
Chief Complaint  Patient presents with   Follow-up    SVT     History of Present Illness: 50 yo male with history of GERD, SVT, HTN, hyperlipidemia and vertigo here today for cardiac follow up. I met him in 2012 when he had c/o dizziness after exercising. Exercise stress test October 2012 with no ischemia,. Echo 2012 with normal LV systolic function, no valvular disease. He was seen in our office July 2017 by Almyra Deforest PA-C with c/o chest pain that was felt to be atypical and palpitations. Exercise stress test 11/14/15 with no ischemia. 30 day event monitor July 2017 with sinus, rare PVCs and one run of SVT. Toprol was started. Coronary CTA September 2017 with zero calcium score and no evidence of CAD. I saw him in the office 01/24/18 and he c/o more frequent episodes of tachycardia several times per month with associated dizziness. Cardiac event monitor with PACs and PVCs but no SVT.   He is here today for follow up. The patient denies any chest pain, dyspnea, palpitations, lower extremity edema, orthopnea, PND, dizziness, near syncope or syncope.      Primary Care Physician: Burnard Bunting, MD  Past Medical History:  Diagnosis Date   Allergic rhinitis    Diverticulitis    Diverticulosis of colon (without mention of hemorrhage) 06/01/2013   CT Scan    GERD (gastroesophageal reflux disease)    HLD (hyperlipidemia)    HTN (hypertension)    SVT (supraventricular tachycardia) (HCC)    Vertigo     Past Surgical History:  Procedure Laterality Date   NASAL SEPTOPLASTY W/ TURBINOPLASTY     VASECTOMY      Current Outpatient Medications  Medication Sig Dispense Refill   aspirin EC 81 MG tablet Take 81 mg by mouth daily.     atorvastatin (LIPITOR) 20 MG tablet Take 20 mg by mouth daily.       losartan (COZAAR) 50 MG tablet Take 50 mg by mouth daily.      Multiple Vitamin (MULTIVITAMIN WITH MINERALS) TABS tablet Take 1 tablet by mouth daily.     pantoprazole (PROTONIX) 40 MG tablet Take  40 mg by mouth daily.       metoprolol succinate (TOPROL-XL) 50 MG 24 hr tablet Take 1 tablet (50 mg total) by mouth daily. 90 tablet 3   No current facility-administered medications for this visit.    Allergies  Allergen Reactions   Sulfa Antibiotics Other (See Comments)    Rash and hives.   Metronidazole Other (See Comments)    Social History   Socioeconomic History   Marital status: Married    Spouse name: Not on file   Number of children: 2   Years of education: Not on file   Highest education level: Not on file  Occupational History   Occupation: Nurse, learning disability  Tobacco Use   Smoking status: Never   Smokeless tobacco: Never  Vaping Use   Vaping Use: Never used  Substance and Sexual Activity   Alcohol use: Yes    Alcohol/week: 2.0 - 3.0 standard drinks    Types: 2 - 3 Standard drinks or equivalent per week    Comment: Socially/ one per day   Drug use: No   Sexual activity: Not on file  Other Topics Concern   Not on file  Social History Narrative   Patient gets regular exercise    Social Determinants of Health   Financial Resource Strain: Not on file  Food Insecurity: Not  on file  Transportation Needs: Not on file  Physical Activity: Not on file  Stress: Not on file  Social Connections: Not on file  Intimate Partner Violence: Not on file    Family History  Problem Relation Age of Onset   Breast cancer Mother    Uterine cancer Mother    Heart disease Father    Heart attack Father    Heart attack Paternal Grandfather     Review of Systems:  As stated in the HPI and otherwise negative.   BP 124/78   Pulse 67   Ht 5' 10.75" (1.797 m)   Wt 190 lb 6.4 oz (86.4 kg)   SpO2 99%   BMI 26.74 kg/m   Physical Examination:  General: Well developed, well nourished, NAD  HEENT: OP clear, mucus membranes moist  SKIN: warm, dry. No rashes. Neuro: No focal deficits  Musculoskeletal: Muscle strength 5/5 all ext  Psychiatric: Mood and affect normal   Neck: No JVD, no carotid bruits, no thyromegaly, no lymphadenopathy.  Lungs:Clear bilaterally, no wheezes, rhonci, crackles Cardiovascular: Regular rate and rhythm. No murmurs, gallops or rubs. Abdomen:Soft. Bowel sounds present. Non-tender.  Extremities: No lower extremity edema. Pulses are 2 + in the bilateral DP/PT.  EKG:  EKG is  ordered today. The ekg ordered today demonstrates Sinus  Recent Labs: No results found for requested labs within last 8760 hours.     Wt Readings from Last 3 Encounters:  10/13/20 190 lb 6.4 oz (86.4 kg)  07/31/19 190 lb 6.4 oz (86.4 kg)  07/07/19 188 lb 2 oz (85.3 kg)     Other studies Reviewed: Additional studies/ records that were reviewed today include: . Review of the above records demonstrates:    Assessment and Plan:   1. PACs/PVCs/History of SVT: Cardiac monitor November 2019 with PVCs and PACs with one episode of possible SVT. No recent palpitations. Will continue Toprol. He will stop ASA.   2. HTN: BP is well controlled. No changes today   Current medicines are reviewed at length with the patient today.  The patient does not have concerns regarding medicines.  The following changes have been made:  no change  Labs/ tests ordered today include:   Orders Placed This Encounter  Procedures   EKG 12-Lead     Disposition:   F/U with me in 12 months  Signed, Lauree Chandler, MD 10/13/2020 10:52 AM    [facility] [place], [place], [place]  [address] Phone: [phone]; Fax: [phone]

## 2020-10-13 NOTE — Patient Instructions (Signed)
Medication Instructions:  Your physician recommends that you continue on your current medications as directed. Please refer to the Current Medication list given to you today.  *If you need a refill on your cardiac medications before your next appointment, please call your pharmacy*  Lab Work: None Ordered If you have labs (blood work) drawn today and your tests are completely normal, you will receive your results only by: . MyChart Message (if you have MyChart) OR . A paper copy in the mail If you have any lab test that is abnormal or we need to change your treatment, we will call you to review the results.   Testing/Procedures: None Ordered   Follow-Up: At [facility], you and your health needs are our priority.  As part of our continuing mission to provide you with exceptional heart care, we have created designated Provider Care Teams.  These Care Teams include your primary Cardiologist (physician) and Advanced Practice Providers (APPs -  Physician Assistants and Nurse Practitioners) who all work together to provide you with the care you need, when you need it.  Your next appointment:   1 year(s)  The format for your next appointment:   In Person  Provider:   You may see Christopher McAlhany, MD or one of the following Advanced Practice Providers on your designated Care Team:    Dayna Dunn, PA-C  Michele Lenze, PA-C    

## 2021-03-13 ENCOUNTER — Telehealth: Payer: Self-pay | Admitting: *Deleted

## 2021-03-13 ENCOUNTER — Other Ambulatory Visit: Payer: Self-pay

## 2021-03-13 ENCOUNTER — Encounter (HOSPITAL_BASED_OUTPATIENT_CLINIC_OR_DEPARTMENT_OTHER): Payer: Self-pay | Admitting: Orthopaedic Surgery

## 2021-03-13 NOTE — Telephone Encounter (Signed)
   Pre-operative Risk Assessment    Patient Name: Gerald Armstrong  DOB: [birth year] MRN: [id number]      Request for Surgical Clearance   Procedure:   RIGHT KNEE SCOPE MENISECTOMY  Date of Surgery: Clearance 03/17/21                                 Surgeon:  DR. Ramond Marrow Surgeon's Group or Practice Name:  [facility] Phone number:  [phone] ATTN: KELLY EXT 3134 Fax number:  [phone] ATTN: KELLY   Type of Clearance Requested: - Medical    Type of Anesthesia:   CHOICE   Additional requests/questions:   [name]   03/13/2021, 3:30 PM

## 2021-03-14 NOTE — Telephone Encounter (Signed)
Left VM

## 2021-03-14 NOTE — Telephone Encounter (Signed)
   Name: Gerald Armstrong  DOB: 03/14/1971  MRN: [id number]   Primary Cardiologist: Verne Carrow, MD  Chart reviewed as part of pre-operative protocol coverage. Patient was contacted 03/14/2021 in reference to pre-operative risk assessment for pending surgery as outlined below.  Gerald Armstrong was last seen on 10/13/20 by Dr. Clifton James.  Since that day, Gerald Armstrong has done well. He exercises several times weekly. He does not have a history of ischemic heart disease with a reassuring CCTA  2017. SVT well-controlled with BB.   Therefore, based on ACC/AHA guidelines, the patient would be at acceptable risk for the planned procedure without further cardiovascular testing.   The patient was advised that if he develops new symptoms prior to surgery to contact our office to arrange for a follow-up visit, and he verbalized understanding.  I will route this recommendation to the requesting party via Epic fax function and remove from pre-op pool. Please call with questions.  Roe Rutherford Shawna Kiener, PA 03/14/2021, 10:28 AM

## 2021-03-17 ENCOUNTER — Ambulatory Visit (HOSPITAL_BASED_OUTPATIENT_CLINIC_OR_DEPARTMENT_OTHER)
Admission: RE | Admit: 2021-03-17 | Payer: No Typology Code available for payment source | Source: Home / Self Care | Admitting: Orthopaedic Surgery

## 2021-03-17 SURGERY — ARTHROSCOPY, KNEE, WITH MEDIAL MENISCECTOMY
Anesthesia: Choice | Site: Knee | Laterality: Right

## 2021-10-05 ENCOUNTER — Other Ambulatory Visit: Payer: Self-pay

## 2021-10-05 MED ORDER — METOPROLOL SUCCINATE ER 50 MG PO TB24: 50.0000 mg | ORAL_TABLET | Freq: Every day | ORAL | 0 refills | Status: DC

## 2021-11-15 ENCOUNTER — Ambulatory Visit: Payer: No Typology Code available for payment source | Admitting: Cardiovascular Disease

## 2021-11-20 ENCOUNTER — Ambulatory Visit (INDEPENDENT_AMBULATORY_CARE_PROVIDER_SITE_OTHER): Payer: 59 | Admitting: Cardiovascular Disease

## 2021-11-20 ENCOUNTER — Encounter: Payer: Self-pay | Admitting: Cardiovascular Disease

## 2021-11-20 VITALS — BP 124/78 | HR 65 | Ht 71.0 in | Wt 190.0 lb

## 2021-11-20 DIAGNOSIS — I10 Essential (primary) hypertension: Secondary | ICD-10-CM | POA: Diagnosis not present

## 2021-11-20 DIAGNOSIS — I471 Supraventricular tachycardia: Secondary | ICD-10-CM

## 2021-11-20 MED ORDER — METOPROLOL SUCCINATE ER 50 MG PO TB24: 50.0000 mg | ORAL_TABLET | Freq: Every day | ORAL | 3 refills | Status: DC

## 2021-11-20 NOTE — Progress Notes (Signed)
Chief Complaint  Patient presents with   Follow-up    PVCs   History of Present Illness: 51 yo male with history of GERD, SVT, HTN, hyperlipidemia and vertigo here today for cardiac follow up. I met him in 2012 when he had c/o dizziness after exercising. Exercise stress test October 2012 with no ischemia,. Echo 2012 with normal LV systolic function, no valvular disease. He was seen in our office July 2017 by Almyra Deforest PA-C with c/o chest pain that was felt to be atypical and palpitations. Exercise stress test 11/14/15 with no ischemia. 30 day event monitor July 2017 with sinus, rare PVCs and one run of SVT. Toprol was started. Coronary CTA September 2017 with zero calcium score and no evidence of CAD. I saw him in the office 01/24/18 and he c/o more frequent episodes of tachycardia several times per month with associated dizziness. Cardiac event monitor in 2019 with PACs and PVCs but no SVT.   He is here today for follow up. The patient denies any chest pain, dyspnea, palpitations, lower extremity edema, orthopnea, PND, dizziness, near syncope or syncope.      Primary Care Physician: Burnard Bunting, MD  Past Medical History:  Diagnosis Date   Allergic rhinitis    Diverticulitis    Diverticulosis of colon (without mention of hemorrhage) 06/01/2013   CT Scan    GERD (gastroesophageal reflux disease)    HLD (hyperlipidemia)    HTN (hypertension)    SVT (supraventricular tachycardia) (HCC)    Vertigo     Past Surgical History:  Procedure Laterality Date   NASAL SEPTOPLASTY W/ TURBINOPLASTY     VASECTOMY      Current Outpatient Medications  Medication Sig Dispense Refill   atorvastatin (LIPITOR) 20 MG tablet Take 20 mg by mouth daily.       losartan (COZAAR) 50 MG tablet Take 50 mg by mouth daily.      Multiple Vitamin (MULTIVITAMIN WITH MINERALS) TABS tablet Take 1 tablet by mouth daily.     pantoprazole (PROTONIX) 40 MG tablet Take 40 mg by mouth daily.       metoprolol succinate  (TOPROL-XL) 50 MG 24 hr tablet Take 1 tablet (50 mg total) by mouth daily. 90 tablet 3   No current facility-administered medications for this visit.    Allergies  Allergen Reactions   Sulfa Antibiotics Other (See Comments)    Rash and hives.   Metronidazole Other (See Comments)    Social History   Socioeconomic History   Marital status: Married    Spouse name: Not on file   Number of children: 2   Years of education: Not on file   Highest education level: Not on file  Occupational History   Occupation: Nurse, learning disability  Tobacco Use   Smoking status: Never   Smokeless tobacco: Never  Vaping Use   Vaping Use: Never used  Substance and Sexual Activity   Alcohol use: Yes    Alcohol/week: 2.0 - 3.0 standard drinks of alcohol    Types: 2 - 3 Standard drinks or equivalent per week    Comment: Socially/ one per day   Drug use: Never   Sexual activity: Not on file  Other Topics Concern   Not on file  Social History Narrative   Patient gets regular exercise    Social Determinants of Health   Financial Resource Strain: Not on file  Food Insecurity: Not on file  Transportation Needs: Not on file  Physical Activity: Not on  file  Stress: Not on file  Social Connections: Not on file  Intimate Partner Violence: Not on file    Family History  Problem Relation Age of Onset   Breast cancer Mother    Uterine cancer Mother    Heart disease Father    Heart attack Father    Heart attack Paternal Grandfather     Review of Systems:  As stated in the HPI and otherwise negative.   BP 124/78   Pulse 65   Ht 5' 11"  (1.803 m)   Wt 190 lb (86.2 kg)   SpO2 99%   BMI 26.50 kg/m   Physical Examination:  General: Well developed, well nourished, NAD  HEENT: OP clear, mucus membranes moist  SKIN: warm, dry. No rashes. Neuro: No focal deficits  Musculoskeletal: Muscle strength 5/5 all ext  Psychiatric: Mood and affect normal  Neck: No JVD, no carotid bruits, no thyromegaly,  no lymphadenopathy.  Lungs:Clear bilaterally, no wheezes, rhonci, crackles Cardiovascular: Regular rate and rhythm. No murmurs, gallops or rubs. Abdomen:Soft. Bowel sounds present. Non-tender.  Extremities: No lower extremity edema. Pulses are 2 + in the bilateral DP/PT.  EKG:  EKG is  ordered today. The ekg ordered today demonstrates Sinus  Recent Labs: No results found for requested labs within last 365 days.     Wt Readings from Last 3 Encounters:  11/20/21 190 lb (86.2 kg)  10/13/20 190 lb 6.4 oz (86.4 kg)  07/31/19 190 lb 6.4 oz (86.4 kg)    Assessment and Plan:   1. PACs/PVCs/History of SVT: Cardiac monitor November 2019 with PVCs and PACs with one episode of possible SVT. He has had no recent palpitations. Continue Toprol.   2. HTN: BP is controlled. No changes  Current medicines are reviewed at length with the patient today.  The patient does not have concerns regarding medicines.  The following changes have been made:  no change  Labs/ tests ordered today include:   Orders Placed This Encounter  Procedures   EKG 12-Lead     Disposition:   F/U with me in 12 months  Signed, Lauree Chandler, MD 11/20/2021 10:25 AM    [facility] [place], [place], [place]  [address] Phone: [phone]; Fax: [phone]

## 2021-11-20 NOTE — Patient Instructions (Signed)
Medication Instructions:  No changes *If you need a refill on your cardiac medications before your next appointment, please call your pharmacy*   Lab Work: none If you have labs (blood work) drawn today and your tests are completely normal, you will receive your results only by: MyChart Message (if you have MyChart) OR A paper copy in the mail If you have any lab test that is abnormal or we need to change your treatment, we will call you to review the results.   Testing/Procedures: none   Follow-Up: At [facility], you and your health needs are our priority.  As part of our continuing mission to provide you with exceptional heart care, we have created designated Provider Care Teams.  These Care Teams include your primary Cardiologist (physician) and Advanced Practice Providers (APPs -  Physician Assistants and Nurse Practitioners) who all work together to provide you with the care you need, when you need it.   Your next appointment:   12 month(s)  The format for your next appointment:   In Person  Provider:   Christopher McAlhany, MD     Important Information About Sugar       

## 2021-12-26 ENCOUNTER — Telehealth: Payer: Self-pay | Admitting: *Deleted

## 2021-12-26 NOTE — Telephone Encounter (Signed)
   Pre-operative Risk Assessment    Patient Name: Gerald Armstrong  DOB: [birth year] MRN: [id number]      Request for Surgical Clearance    Procedure:   RIGHT PARTIAL KNEE REPLACEMENT   Date of Surgery:  Clearance TBD                                Surgeon:  DR. DANIEL MARCHWIANY Surgeon's Group or Practice Name:  [facility] Phone number:  [phone] EXT 3134 Fax number:  [phone] ATTN: Gerald Armstrong   Type of Clearance Requested:   - Medical ; NO MEDICATIONS LISTED AS NEEDING TO BE HELD   Type of Anesthesia:  Spinal   Additional requests/questions:    Gerald Armstrong   12/26/2021, 5:24 PM

## 2021-12-29 NOTE — Telephone Encounter (Signed)
   Primary Cardiologist: Verne Carrow, MD  Chart reviewed as part of pre-operative protocol coverage. Given past medical history and time since last visit, based on ACC/AHA guidelines, Gerald Armstrong would be at acceptable risk for the planned procedure without further cardiovascular testing.   Patient should be advised that if he develops new symptoms prior to surgery to contact our office to arrange a follow-up appointment.    I will route this recommendation to the requesting party via Epic fax function and remove from pre-op pool.  Please call with questions.  Levi Aland, NP-C  12/29/2021, 10:35 AM [address], [address] Office [phone] Fax [phone]

## 2022-01-06 ENCOUNTER — Other Ambulatory Visit: Payer: Self-pay | Admitting: Cardiovascular Disease

## 2022-01-08 ENCOUNTER — Encounter: Payer: Self-pay | Admitting: Cardiovascular Disease

## 2022-12-13 NOTE — Progress Notes (Signed)
Chief Complaint  Patient presents with   Follow-up    SVT   History of Present Illness: 52 yo male with history of GERD, SVT, HTN, hyperlipidemia and vertigo here today for cardiac follow up. I met him in 2012 when he had c/o dizziness after exercising. Exercise stress test October 2012 with no ischemia. Echo 2012 with normal LV systolic function, no valvular disease. He was seen in our office July 2017 with c/o chest pain that was felt to be atypical and palpitations. Exercise stress test 11/14/15 with no ischemia. 30 day event monitor July 2017 with sinus, rare PVCs and one run of SVT. Toprol was started. Coronary CTA September 2017 with zero calcium score and no evidence of CAD. I saw him in the office in October 2019 and he c/o more frequent episodes of tachycardia several times per month with associated dizziness. Cardiac event monitor in 2019 with PACs and PVCs but no SVT.   He is here today for follow up. The patient denies any chest pain, dyspnea, palpitations, lower extremity edema, orthopnea, PND, dizziness, near syncope or syncope.      Primary Care Physician: Geoffry Paradise, MD  Past Medical History:  Diagnosis Date   Allergic rhinitis    Diverticulitis    Diverticulosis of colon (without mention of hemorrhage) 06/01/2013   CT Scan    GERD (gastroesophageal reflux disease)    HLD (hyperlipidemia)    HTN (hypertension)    SVT (supraventricular tachycardia)    Vertigo     Past Surgical History:  Procedure Laterality Date   NASAL SEPTOPLASTY W/ TURBINOPLASTY     VASECTOMY      Current Outpatient Medications  Medication Sig Dispense Refill   atorvastatin (LIPITOR) 20 MG tablet Take 20 mg by mouth daily.       metoprolol succinate (TOPROL-XL) 50 MG 24 hr tablet TAKE 1 TABLET BY MOUTH DAILY. 90 tablet 3   Multiple Vitamin (MULTIVITAMIN WITH MINERALS) TABS tablet Take 1 tablet by mouth daily.     olmesartan (BENICAR) 40 MG tablet Take 40 mg by mouth daily.      pantoprazole (PROTONIX) 40 MG tablet Take 40 mg by mouth daily.       No current facility-administered medications for this visit.    Allergies  Allergen Reactions   Sulfa Antibiotics Other (See Comments)    Rash and hives.   Metronidazole Other (See Comments)    Social History   Socioeconomic History   Marital status: Married    Spouse name: Not on file   Number of children: 2   Years of education: Not on file   Highest education level: Not on file  Occupational History   Occupation: Manufacturing systems engineer  Tobacco Use   Smoking status: Never   Smokeless tobacco: Never  Vaping Use   Vaping status: Never Used  Substance and Sexual Activity   Alcohol use: Yes    Alcohol/week: 2.0 - 3.0 standard drinks of alcohol    Types: 2 - 3 Standard drinks or equivalent per week    Comment: Socially/ one per day   Drug use: Never   Sexual activity: Not on file  Other Topics Concern   Not on file  Social History Narrative   Patient gets regular exercise    Social Determinants of Health   Financial Resource Strain: Not on file  Food Insecurity: Not on file  Transportation Needs: Not on file  Physical Activity: Not on file  Stress: Not on file  Social Connections: Unknown (08/27/2021)   Received from [facility]   Social Network    Social Network: Not on file  Intimate Partner Violence: Unknown (07/19/2021)   Received from [facility], [facility]   HITS    Physically Hurt: Not on file    Insult or Talk Down To: Not on file    Threaten Physical Harm: Not on file    Scream or Curse: Not on file    Family History  Problem Relation Age of Onset   Breast cancer Mother    Uterine cancer Mother    Heart disease Father    Heart attack Father    Heart attack Paternal Grandfather     Review of Systems:  As stated in the HPI and otherwise negative.   BP 110/80   Pulse 77   Ht 5\' 11"  (1.803 m)   Wt 87.9 kg   SpO2 99%   BMI 27.03 kg/m   Physical  Examination: General: Well developed, well nourished, NAD  HEENT: OP clear, mucus membranes moist  SKIN: warm, dry. No rashes. Neuro: No focal deficits  Musculoskeletal: Muscle strength 5/5 all ext  Psychiatric: Mood and affect normal  Neck: No JVD, no carotid bruits, no thyromegaly, no lymphadenopathy.  Lungs:Clear bilaterally, no wheezes, rhonci, crackles Cardiovascular: Regular rate and rhythm. No murmurs, gallops or rubs. Abdomen:Soft. Bowel sounds present. Non-tender.  Extremities: No lower extremity edema. Pulses are 2 + in the bilateral DP/PT.  EKG:  EKG is ordered today. The ekg ordered today demonstrates  EKG Interpretation Date/Time:  Friday December 14 2022 09:16:05 EDT Ventricular Rate:  77 PR Interval:  208 QRS Duration:  74 QT Interval:  392 QTC Calculation: 443 R Axis:   78  Text Interpretation: Normal sinus rhythm Normal ECG Confirmed by Verne Carrow [phone]) on 12/14/2022 9:17:12 AM    Recent Labs: No results found for requested labs within last 365 days.    Wt Readings from Last 3 Encounters:  12/14/22 87.9 kg  11/20/21 86.2 kg  10/13/20 86.4 kg    Assessment and Plan:   1. PACs/PVCs/History of SVT: Cardiac monitor November 2019 with PVCs and PACs with one episode of possible SVT. No recent palpitations. Will continue Toprol.   2. HTN: BP is well controlled. No changes  Labs/ tests ordered today include:   Orders Placed This Encounter  Procedures   EKG 12-Lead   Disposition:   F/U with me in 12 months  Signed, Verne Carrow, MD 12/14/2022 9:51 AM    [facility] [address] [place], [place], [place]  [address] Phone: [phone]; Fax: [phone]

## 2022-12-14 ENCOUNTER — Ambulatory Visit: Payer: 59 | Attending: Cardiovascular Disease | Admitting: Cardiovascular Disease

## 2022-12-14 ENCOUNTER — Encounter: Payer: Self-pay | Admitting: Cardiovascular Disease

## 2022-12-14 VITALS — BP 110/80 | HR 77 | Ht 71.0 in | Wt 193.8 lb

## 2022-12-14 DIAGNOSIS — I471 Supraventricular tachycardia, unspecified: Secondary | ICD-10-CM | POA: Diagnosis not present

## 2022-12-14 NOTE — Patient Instructions (Addendum)
Medication Instructions:  Your physician recommends that you continue on your current medications as directed. Please refer to the Current Medication list given to you today.  *If you need a refill on your cardiac medications before your next appointment, please call your pharmacy*  Lab Work: None ordered today  Testing/Procedures: None ordered today  Follow-Up: At [facility], you and your health needs are our priority.  As part of our continuing mission to provide you with exceptional heart care, we have created designated Provider Care Teams.  These Care Teams include your primary Cardiologist (physician) and Advanced Practice Providers (APPs -  Physician Assistants and Nurse Practitioners) who all work together to provide you with the care you need, when you need it.  Your next appointment:   12 month(s)  The format for your next appointment:   In Person  Provider:   Lauree Chandler, MD

## 2023-01-10 ENCOUNTER — Encounter: Payer: Self-pay | Admitting: Cardiovascular Disease

## 2023-01-10 MED ORDER — METOPROLOL SUCCINATE ER 50 MG PO TB24: 50.0000 mg | ORAL_TABLET | Freq: Every day | ORAL | 3 refills | Status: DC

## 2023-10-16 DIAGNOSIS — L814 Other melanin hyperpigmentation: Secondary | ICD-10-CM | POA: Diagnosis not present

## 2023-10-16 DIAGNOSIS — D225 Melanocytic nevi of trunk: Secondary | ICD-10-CM | POA: Diagnosis not present

## 2023-10-16 DIAGNOSIS — D2272 Melanocytic nevi of left lower limb, including hip: Secondary | ICD-10-CM | POA: Diagnosis not present

## 2023-10-16 DIAGNOSIS — Z85828 Personal history of other malignant neoplasm of skin: Secondary | ICD-10-CM | POA: Diagnosis not present

## 2023-12-09 ENCOUNTER — Ambulatory Visit: Payer: Self-pay | Attending: Cardiovascular Disease | Admitting: Cardiovascular Disease

## 2023-12-09 ENCOUNTER — Encounter: Payer: Self-pay | Admitting: Cardiovascular Disease

## 2023-12-09 VITALS — BP 122/84 | HR 65 | Ht 71.0 in | Wt 190.6 lb

## 2023-12-09 DIAGNOSIS — I471 Supraventricular tachycardia, unspecified: Secondary | ICD-10-CM

## 2023-12-09 DIAGNOSIS — I10 Essential (primary) hypertension: Secondary | ICD-10-CM | POA: Diagnosis not present

## 2023-12-09 NOTE — Patient Instructions (Signed)
 Medication Instructions:  No changes *If you need a refill on your cardiac medications before your next appointment, please call your pharmacy*  Lab Work: none   Testing/Procedures: none  Follow-Up: At [facility], you and your health needs are our priority.  As part of our continuing mission to provide you with exceptional heart care, our providers are all part of one team.  This team includes your primary Cardiologist (physician) and Advanced Practice Providers or APPs (Physician Assistants and Nurse Practitioners) who all work together to provide you with the care you need, when you need it.  Your next appointment:   12 month(s)  Provider:   Antoinette Batman, MD

## 2023-12-09 NOTE — Progress Notes (Signed)
 Chief Complaint  Patient presents with   Follow-up    HTN   History of Present Illness: 53 yo male with history of GERD, SVT, HTN, hyperlipidemia and vertigo here today for cardiac follow up. I met him in 2012 when he had c/o dizziness after exercising. Exercise stress test October 2012 with no ischemia. Echo 2012 with normal LV systolic function, no valvular disease. He was seen in our office July 2017 with c/o chest pain that was felt to be atypical and palpitations. Exercise stress test 11/14/15 with no ischemia. 30 day event monitor July 2017 with sinus, rare PVCs and one run of SVT. Toprol  was started. Coronary CTA September 2017 with zero calcium score and no evidence of CAD. I saw him in the office in October 2019 and he c/o more frequent episodes of tachycardia several times per month with associated dizziness. Cardiac event monitor in 2019 with PACs and PVCs but no SVT.   He is here today for follow up. The patient denies any chest pain, dyspnea, palpitations, lower extremity edema, orthopnea, PND, dizziness, near syncope or syncope.      Primary Care Physician: Shepard Ade, MD  Past Medical History:  Diagnosis Date   Allergic rhinitis    Diverticulitis    Diverticulosis of colon (without mention of hemorrhage) 06/01/2013   CT Scan    GERD (gastroesophageal reflux disease)    HLD (hyperlipidemia)    HTN (hypertension)    SVT (supraventricular tachycardia) (HCC)    Vertigo     Past Surgical History:  Procedure Laterality Date   NASAL SEPTOPLASTY W/ TURBINOPLASTY     VASECTOMY      Current Outpatient Medications  Medication Sig Dispense Refill   atorvastatin (LIPITOR) 20 MG tablet Take 20 mg by mouth daily.       metoprolol  succinate (TOPROL -XL) 50 MG 24 hr tablet Take 1 tablet (50 mg total) by mouth daily. 90 tablet 3   Multiple Vitamin (MULTIVITAMIN WITH MINERALS) TABS tablet Take 1 tablet by mouth daily.     olmesartan (BENICAR) 40 MG tablet Take 40 mg by mouth  daily.     pantoprazole (PROTONIX) 40 MG tablet Take 40 mg by mouth daily.       No current facility-administered medications for this visit.    Allergies  Allergen Reactions   Sulfa Antibiotics Other (See Comments)    Rash and hives.   Metronidazole Other (See Comments)    Social History   Socioeconomic History   Marital status: Married    Spouse name: Not on file   Number of children: 2   Years of education: Not on file   Highest education level: Not on file  Occupational History   Occupation: Manufacturing systems engineer  Tobacco Use   Smoking status: Never   Smokeless tobacco: Never  Vaping Use   Vaping status: Never Used  Substance and Sexual Activity   Alcohol use: Yes    Alcohol/week: 2.0 - 3.0 standard drinks of alcohol    Types: 2 - 3 Standard drinks or equivalent per week    Comment: Socially/ one per day   Drug use: Never   Sexual activity: Not on file  Other Topics Concern   Not on file  Social History Narrative   Patient gets regular exercise    Social Drivers of Health   Financial Resource Strain: Not on file  Food Insecurity: Not on file  Transportation Needs: Not on file  Physical Activity: Not on file  Stress:  Not on file  Social Connections: Unknown (08/27/2021)   Received from [facility]   Social Network    Social Network: Not on file  Intimate Partner Violence: Unknown (07/19/2021)   Received from [facility]   HITS    Physically Hurt: Not on file    Insult or Talk Down To: Not on file    Threaten Physical Harm: Not on file    Scream or Curse: Not on file    Family History  Problem Relation Age of Onset   Breast cancer Mother    Uterine cancer Mother    Heart disease Father    Heart attack Father    Heart attack Paternal Grandfather     Review of Systems:  As stated in the HPI and otherwise negative.   BP 122/84   Pulse 65   Ht 5' 11 (1.803 m)   Wt 190 lb 9.6 oz (86.5 kg)   SpO2 99%   BMI 26.58 kg/m   Physical  Examination: General: Well developed, well nourished, NAD  HEENT: OP clear, mucus membranes moist  SKIN: warm, dry. No rashes. Neuro: No focal deficits  Musculoskeletal: Muscle strength 5/5 all ext  Psychiatric: Mood and affect normal  Neck: No JVD, no carotid bruits, no thyromegaly, no lymphadenopathy.  Lungs:Clear bilaterally, no wheezes, rhonci, crackles Cardiovascular: Regular rate and rhythm. No murmurs, gallops or rubs. Abdomen:Soft. Bowel sounds present. Non-tender.  Extremities: No lower extremity edema. Pulses are 2 + in the bilateral DP/PT.  EKG:  EKG is  ordered today. The ekg ordered today demonstrates  EKG Interpretation Date/Time:  Monday December 09 2023 09:08:37 EDT Ventricular Rate:  64 PR Interval:  198 QRS Duration:  74 QT Interval:  394 QTC Calculation: 406 R Axis:   66  Text Interpretation: Normal sinus rhythm Normal ECG Confirmed by Verlin Bruckner [phone]) on 12/09/2023 9:18:55 AM    Recent Labs: No results found for requested labs within last 365 days.    Wt Readings from Last 3 Encounters:  12/09/23 190 lb 9.6 oz (86.5 kg)  12/14/22 193 lb 12.8 oz (87.9 kg)  11/20/21 190 lb (86.2 kg)    Assessment and Plan:   1. PACs/PVCs/History of SVT: Cardiac monitor November 2019 with PVCs and PACs with one episode of possible SVT. No recent palpitations. Continue Toprol .   2. HTN: BP is controlled. Continue Toprol  and Benicar.   3. HLD: Lipids followed in primary care. Continue statin  Labs/ tests ordered today include:   Orders Placed This Encounter  Procedures   EKG 12-Lead   Disposition:   F/U with me in 12 months  Signed, Bruckner Verlin, MD 12/09/2023 9:30 AM    [facility] [address] [place], [place], [place]  [address] Phone: [phone]; Fax: [phone]

## 2024-01-10 DIAGNOSIS — H11002 Unspecified pterygium of left eye: Secondary | ICD-10-CM | POA: Diagnosis not present

## 2024-01-10 DIAGNOSIS — H5213 Myopia, bilateral: Secondary | ICD-10-CM | POA: Diagnosis not present

## 2024-01-13 ENCOUNTER — Encounter: Payer: Self-pay | Admitting: Cardiovascular Disease

## 2024-01-14 MED ORDER — METOPROLOL SUCCINATE ER 50 MG PO TB24: 50.0000 mg | ORAL_TABLET | Freq: Every day | ORAL | 3 refills | Status: AC

## 2024-01-20 DIAGNOSIS — Z0189 Encounter for other specified special examinations: Secondary | ICD-10-CM | POA: Diagnosis not present

## 2024-01-20 DIAGNOSIS — Z125 Encounter for screening for malignant neoplasm of prostate: Secondary | ICD-10-CM | POA: Diagnosis not present

## 2024-01-20 DIAGNOSIS — E785 Hyperlipidemia, unspecified: Secondary | ICD-10-CM | POA: Diagnosis not present

## 2024-02-03 DIAGNOSIS — I10 Essential (primary) hypertension: Secondary | ICD-10-CM | POA: Diagnosis not present

## 2024-02-03 DIAGNOSIS — Z23 Encounter for immunization: Secondary | ICD-10-CM | POA: Diagnosis not present

## 2024-02-03 DIAGNOSIS — R82998 Other abnormal findings in urine: Secondary | ICD-10-CM | POA: Diagnosis not present

## 2024-02-03 DIAGNOSIS — Z0000 Encounter for general adult medical examination without abnormal findings: Secondary | ICD-10-CM | POA: Diagnosis not present

## 2024-02-04 DIAGNOSIS — M19011 Primary osteoarthritis, right shoulder: Secondary | ICD-10-CM | POA: Diagnosis not present

## 2024-02-04 DIAGNOSIS — S46011A Strain of muscle(s) and tendon(s) of the rotator cuff of right shoulder, initial encounter: Secondary | ICD-10-CM | POA: Diagnosis not present

## 2024-02-24 ENCOUNTER — Ambulatory Visit (AMBULATORY_SURGERY_CENTER)

## 2024-02-24 VITALS — Ht 71.0 in | Wt 190.0 lb

## 2024-02-24 DIAGNOSIS — Z1211 Encounter for screening for malignant neoplasm of colon: Secondary | ICD-10-CM

## 2024-02-24 MED ORDER — NA SULFATE-K SULFATE-MG SULF 17.5-3.13-1.6 GM/177ML PO SOLN: 1.0000 | Freq: Once | ORAL | 0 refills | Status: AC

## 2024-02-24 NOTE — Progress Notes (Signed)

## 2024-03-09 ENCOUNTER — Encounter: Payer: Self-pay | Admitting: Internal Medicine

## 2024-03-19 ENCOUNTER — Encounter: Payer: Self-pay | Admitting: Internal Medicine

## 2024-03-19 ENCOUNTER — Ambulatory Visit: Admitting: Internal Medicine

## 2024-03-19 VITALS — BP 99/71 | HR 79 | Temp 97.0°F | Resp 14 | Ht 71.0 in | Wt 190.0 lb

## 2024-03-19 DIAGNOSIS — K5730 Diverticulosis of large intestine without perforation or abscess without bleeding: Secondary | ICD-10-CM | POA: Diagnosis not present

## 2024-03-19 DIAGNOSIS — Z1211 Encounter for screening for malignant neoplasm of colon: Secondary | ICD-10-CM

## 2024-03-19 DIAGNOSIS — K648 Other hemorrhoids: Secondary | ICD-10-CM

## 2024-03-19 DIAGNOSIS — D120 Benign neoplasm of cecum: Secondary | ICD-10-CM

## 2024-03-19 MED ORDER — SODIUM CHLORIDE 0.9 % IV SOLN: 500.0000 mL | Freq: Once | INTRAVENOUS | Status: DC

## 2024-03-19 NOTE — Op Note (Signed)
 [facility] Patient Name: Gerald Armstrong Procedure Date: 03/19/2024 11:22 AM MRN: [id number] Endoscopist: Norleen SAILOR. Abran , MD, [id number] Age: 53 Referring MD:  Date of Birth: 02-11-1971 Gender: Male Account #: [id number] Procedure:                Colonoscopy with cold snare polypectomy x 1 Indications:              Screening for colorectal malignant neoplasm Medicines:                Monitored Anesthesia Care Procedure:                Pre-Anesthesia Assessment:                           - Prior to the procedure, a History and Physical                            was performed, and patient medications and                            allergies were reviewed. The patient's tolerance of                            previous anesthesia was also reviewed. The risks                            and benefits of the procedure and the sedation                            options and risks were discussed with the patient.                            All questions were answered, and informed consent                            was obtained. Prior Anticoagulants: The patient has                            taken no anticoagulant or antiplatelet agents. ASA                            Grade Assessment: II - A patient with mild systemic                            disease. After reviewing the risks and benefits,                            the patient was deemed in satisfactory condition to                            undergo the procedure.                           After obtaining informed consent, the colonoscope  was passed under direct vision. Throughout the                            procedure, the patient's blood pressure, pulse, and                            oxygen saturations were monitored continuously. The                            CF [id number] #[id number] was introduced through the anus                            and advanced to the the cecum, identified by                             appendiceal orifice and ileocecal valve. The                            ileocecal valve, appendiceal orifice, and rectum                            were photographed. The quality of the bowel                            preparation was excellent. The colonoscopy was                            performed without difficulty. The patient tolerated                            the procedure well. The bowel preparation used was                            SUPREP via split dose instruction. Scope In: 11:33:26 AM Scope Out: 11:45:45 AM Scope Withdrawal Time: 0 hours 10 minutes 35 seconds  Total Procedure Duration: 0 hours 12 minutes 19 seconds  Findings:                 A 3 mm polyp was found in the cecum. The polyp was                            removed with a cold snare. Resection and retrieval                            were complete.                           Multiple diverticula were found in the left colon                            and right colon.                           Internal hemorrhoids were found during retroflexion.  The exam was otherwise without abnormality on                            direct and retroflexion views. Complications:            No immediate complications. Estimated blood loss:                            None. Estimated Blood Loss:     Estimated blood loss: none. Impression:               - One 3 mm polyp in the cecum, removed with a cold                            snare. Resected and retrieved.                           - Diverticulosis in the left colon and in the right                            colon.                           - Internal hemorrhoids.                           - The examination was otherwise normal on direct                            and retroflexion views. Recommendation:           - Repeat colonoscopy in 7-10 years for surveillance.                           - Patient has a contact number available for                             emergencies. The signs and symptoms of potential                            delayed complications were discussed with the                            patient. Return to normal activities tomorrow.                            Written discharge instructions were provided to the                            patient.                           - Resume previous diet.                           - Continue present medications.                           -  Await pathology results. Norleen SAILOR. Abran, MD 03/19/2024 11:52:08 AM This report has been signed electronically.

## 2024-03-19 NOTE — Patient Instructions (Signed)
-   Resume previous diet - Continue present medications. - Await pathology results - Repeat colonoscopy in 7-10 years for surveillance    YOU HAD AN ENDOSCOPIC PROCEDURE TODAY AT THE [facility]:   Refer to the procedure report that was given to you for any specific questions about what was found during the examination.  If the procedure report does not answer your questions, please call your gastroenterologist to clarify.  If you requested that your care partner not be given the details of your procedure findings, then the procedure report has been included in a sealed envelope for you to review at your convenience later.  YOU SHOULD EXPECT: Some feelings of bloating in the abdomen. Passage of more gas than usual.  Walking can help get rid of the air that was put into your GI tract during the procedure and reduce the bloating. If you had a lower endoscopy (such as a colonoscopy or flexible sigmoidoscopy) you may notice spotting of blood in your stool or on the toilet paper. If you underwent a bowel prep for your procedure, you may not have a normal bowel movement for a few days.  Please Note:  You might notice some irritation and congestion in your nose or some drainage.  This is from the oxygen used during your procedure.  There is no need for concern and it should clear up in a day or so.  SYMPTOMS TO REPORT IMMEDIATELY:  Following lower endoscopy (colonoscopy or flexible sigmoidoscopy):  Excessive amounts of blood in the stool  Significant tenderness or worsening of abdominal pains  Swelling of the abdomen that is new, acute  Fever of 100F or higher  For urgent or emergent issues, a gastroenterologist can be reached at any hour by calling [phone]. Do not use MyChart messaging for urgent concerns.    DIET:  We do recommend a small meal at first, but then you may proceed to your regular diet.  Drink plenty of fluids but you should avoid alcoholic beverages for 24  hours.  ACTIVITY:  You should plan to take it easy for the rest of today and you should NOT DRIVE or use heavy machinery until tomorrow (because of the sedation medicines used during the test).    FOLLOW UP: Our staff will call the number listed on your records the next business day following your procedure.  We will call around 7:15- 8:00 am to check on you and address any questions or concerns that you may have regarding the information given to you following your procedure. If we do not reach you, we will leave a message.     If any biopsies were taken you will be contacted by phone or by letter within the next 1-3 weeks.  Please call us at [phone] if you have not heard about the biopsies in 3 weeks.    SIGNATURES/CONFIDENTIALITY: You and/or your care partner have signed paperwork which will be entered into your electronic medical record.  These signatures attest to the fact that that the information above on your After Visit Summary has been reviewed and is understood.  Full responsibility of the confidentiality of this discharge information lies with you and/or your care-partner.

## 2024-03-19 NOTE — Progress Notes (Signed)
 Pt's states no medical or surgical changes since previsit or office visit.

## 2024-03-19 NOTE — Progress Notes (Signed)
 HISTORY OF PRESENT ILLNESS:  Gerald Armstrong is a 53 y.o. male presents today for screening colonoscopy.  Index exam 2015 was negative for neoplasia (diverticulosis).  No complaints  REVIEW OF SYSTEMS:  All non-GI ROS negative except for  Past Medical History:  Diagnosis Date   Allergic rhinitis    Allergy    Diverticulitis    Diverticulosis of colon (without mention of hemorrhage) 06/01/2013   CT Scan    GERD (gastroesophageal reflux disease)    HLD (hyperlipidemia)    HTN (hypertension)    SVT (supraventricular tachycardia)    Vertigo     Past Surgical History:  Procedure Laterality Date   NASAL SEPTOPLASTY W/ TURBINOPLASTY     VASECTOMY      Social History DMARION PERFECT  reports that he has never smoked. He has never used smokeless tobacco. He reports current alcohol use of about 2.0 - 3.0 standard drinks of alcohol per week. He reports that he does not use drugs.  family history includes Breast cancer in his mother; Heart attack in his father and paternal grandfather; Heart disease in his father; Uterine cancer in his mother.  Allergies  Allergen Reactions   Sulfa Antibiotics Other (See Comments)    Rash and hives.   Metronidazole Other (See Comments)    Numbness in legs       PHYSICAL EXAMINATION: Vital signs: BP 127/78   Pulse 85   Temp (!) 97 F (36.1 C)   Ht 5' 11 (1.803 m)   Wt 190 lb (86.2 kg)   SpO2 99%   BMI 26.50 kg/m  General: Well-developed, well-nourished, no acute distress HEENT: Sclerae are anicteric, conjunctiva pink. Oral mucosa intact Lungs: Clear Heart: Regular Abdomen: soft, nontender, nondistended, no obvious ascites, no peritoneal signs, normal bowel sounds. No organomegaly. Extremities: No edema Psychiatric: alert and oriented x3. Cooperative     ASSESSMENT:  Colon cancer screening   PLAN:   Screening colonoscopy

## 2024-03-19 NOTE — Progress Notes (Signed)
 A/o x 3, VSS, good SR's, pleased with anesthesia, report to RN

## 2024-03-19 NOTE — Progress Notes (Signed)
 Called to room to assist during endoscopic procedure.  Patient ID and intended procedure confirmed with present staff. Received instructions for my participation in the procedure from the performing physician.

## 2024-03-20 ENCOUNTER — Telehealth: Payer: Self-pay

## 2024-03-20 NOTE — Telephone Encounter (Signed)
 Follow up call to pt, no answer.

## 2024-03-23 ENCOUNTER — Ambulatory Visit: Payer: Self-pay | Admitting: Internal Medicine

## 2024-03-23 LAB — SURGICAL PATHOLOGY
